# Patient Record
Sex: Female | Born: 1935 | Hispanic: No | Marital: Married | Smoking: Never smoker
Health system: Southern US, Community
[De-identification: ages and names within clinical notes are randomized; demographics above are authoritative.]

## PROBLEM LIST (undated history)

## (undated) DIAGNOSIS — I10 Essential (primary) hypertension: Secondary | ICD-10-CM

---

## 2000-04-22 ENCOUNTER — Ambulatory Visit (HOSPITAL_COMMUNITY): Admission: RE | Admit: 2000-04-22 | Discharge: 2000-04-22 | Payer: Self-pay | Admitting: Family Medicine

## 2000-04-22 ENCOUNTER — Encounter: Payer: Self-pay | Admitting: Family Medicine

## 2004-09-14 ENCOUNTER — Ambulatory Visit: Payer: Self-pay | Admitting: Nurse Practitioner

## 2006-05-05 ENCOUNTER — Encounter: Payer: Self-pay | Admitting: Vascular Surgery

## 2006-05-05 ENCOUNTER — Ambulatory Visit (HOSPITAL_COMMUNITY): Admission: RE | Admit: 2006-05-05 | Discharge: 2006-05-05 | Payer: Self-pay | Admitting: Family Medicine

## 2006-05-05 ENCOUNTER — Ambulatory Visit: Payer: Self-pay | Admitting: Nurse Practitioner

## 2006-05-06 ENCOUNTER — Ambulatory Visit: Payer: Self-pay | Admitting: Nurse Practitioner

## 2006-05-10 ENCOUNTER — Ambulatory Visit: Payer: Self-pay | Admitting: Nurse Practitioner

## 2006-05-12 ENCOUNTER — Ambulatory Visit: Payer: Self-pay | Admitting: Nurse Practitioner

## 2006-05-17 ENCOUNTER — Ambulatory Visit: Payer: Self-pay | Admitting: Nurse Practitioner

## 2006-05-19 ENCOUNTER — Ambulatory Visit: Payer: Self-pay | Admitting: Nurse Practitioner

## 2006-05-26 ENCOUNTER — Ambulatory Visit: Payer: Self-pay | Admitting: Nurse Practitioner

## 2006-06-02 ENCOUNTER — Ambulatory Visit: Payer: Self-pay | Admitting: Nurse Practitioner

## 2006-06-20 ENCOUNTER — Ambulatory Visit: Payer: Self-pay | Admitting: Nurse Practitioner

## 2006-06-23 ENCOUNTER — Ambulatory Visit: Payer: Self-pay | Admitting: Nurse Practitioner

## 2006-07-21 ENCOUNTER — Ambulatory Visit: Payer: Self-pay | Admitting: Nurse Practitioner

## 2006-07-25 ENCOUNTER — Ambulatory Visit: Payer: Self-pay | Admitting: Nurse Practitioner

## 2006-08-01 ENCOUNTER — Ambulatory Visit: Payer: Self-pay | Admitting: Nurse Practitioner

## 2006-08-08 ENCOUNTER — Ambulatory Visit: Payer: Self-pay | Admitting: Nurse Practitioner

## 2006-08-15 ENCOUNTER — Ambulatory Visit: Payer: Self-pay | Admitting: Nurse Practitioner

## 2006-08-22 ENCOUNTER — Ambulatory Visit: Payer: Self-pay | Admitting: Nurse Practitioner

## 2006-09-21 ENCOUNTER — Ambulatory Visit: Payer: Self-pay | Admitting: Nurse Practitioner

## 2006-10-17 ENCOUNTER — Ambulatory Visit: Payer: Self-pay | Admitting: Nurse Practitioner

## 2007-02-27 ENCOUNTER — Ambulatory Visit: Payer: Self-pay | Admitting: Internal Medicine

## 2007-03-17 ENCOUNTER — Ambulatory Visit: Payer: Self-pay | Admitting: Internal Medicine

## 2007-03-21 ENCOUNTER — Ambulatory Visit (HOSPITAL_COMMUNITY): Admission: RE | Admit: 2007-03-21 | Discharge: 2007-03-21 | Payer: Self-pay | Admitting: Nurse Practitioner

## 2007-04-21 ENCOUNTER — Ambulatory Visit: Payer: Self-pay | Admitting: Family Medicine

## 2007-12-05 ENCOUNTER — Ambulatory Visit: Payer: Self-pay | Admitting: Internal Medicine

## 2007-12-07 ENCOUNTER — Ambulatory Visit (HOSPITAL_COMMUNITY): Admission: RE | Admit: 2007-12-07 | Discharge: 2007-12-07 | Payer: Self-pay | Admitting: Nurse Practitioner

## 2007-12-19 ENCOUNTER — Ambulatory Visit (HOSPITAL_COMMUNITY): Admission: RE | Admit: 2007-12-19 | Discharge: 2007-12-19 | Payer: Self-pay | Admitting: Family Medicine

## 2007-12-19 ENCOUNTER — Encounter (INDEPENDENT_AMBULATORY_CARE_PROVIDER_SITE_OTHER): Payer: Self-pay | Admitting: Family Medicine

## 2008-08-21 ENCOUNTER — Ambulatory Visit: Payer: Self-pay | Admitting: Internal Medicine

## 2008-08-21 ENCOUNTER — Encounter (INDEPENDENT_AMBULATORY_CARE_PROVIDER_SITE_OTHER): Payer: Self-pay | Admitting: Internal Medicine

## 2008-08-21 LAB — CONVERTED CEMR LAB
ALT: 18 units/L (ref 0–35)
AST: 21 units/L (ref 0–37)
Albumin: 4.4 g/dL (ref 3.5–5.2)
Basophils Absolute: 0 10*3/uL (ref 0.0–0.1)
Basophils Relative: 0 % (ref 0–1)
Calcium: 9.1 mg/dL (ref 8.4–10.5)
Chloride: 105 meq/L (ref 96–112)
Creatinine, Ser: 0.78 mg/dL (ref 0.40–1.20)
LDL Cholesterol: 171 mg/dL — ABNORMAL HIGH (ref 0–99)
MCHC: 32.6 g/dL (ref 30.0–36.0)
Neutro Abs: 1.7 10*3/uL (ref 1.7–7.7)
Neutrophils Relative %: 42 % — ABNORMAL LOW (ref 43–77)
Potassium: 3.8 meq/L (ref 3.5–5.3)
RDW: 14.7 % (ref 11.5–15.5)
Total CHOL/HDL Ratio: 4.4

## 2008-10-28 DEATH — deceased

## 2009-03-05 ENCOUNTER — Ambulatory Visit: Payer: Self-pay | Admitting: Family Medicine

## 2009-03-27 ENCOUNTER — Ambulatory Visit (HOSPITAL_COMMUNITY): Admission: RE | Admit: 2009-03-27 | Discharge: 2009-03-27 | Payer: Self-pay | Admitting: Internal Medicine

## 2009-03-27 ENCOUNTER — Ambulatory Visit: Payer: Self-pay | Admitting: Family Medicine

## 2009-04-10 ENCOUNTER — Ambulatory Visit (HOSPITAL_COMMUNITY): Admission: RE | Admit: 2009-04-10 | Discharge: 2009-04-10 | Payer: Self-pay | Admitting: Family Medicine

## 2009-05-12 ENCOUNTER — Encounter (INDEPENDENT_AMBULATORY_CARE_PROVIDER_SITE_OTHER): Payer: Self-pay | Admitting: Internal Medicine

## 2009-05-12 ENCOUNTER — Ambulatory Visit: Payer: Self-pay | Admitting: Internal Medicine

## 2009-05-12 LAB — CONVERTED CEMR LAB
ALT: 19 units/L (ref 0–35)
AST: 23 units/L (ref 0–37)
Albumin: 4.1 g/dL (ref 3.5–5.2)
CO2: 23 meq/L (ref 19–32)
Calcium: 9 mg/dL (ref 8.4–10.5)
Chloride: 103 meq/L (ref 96–112)
Creatinine, Ser: 0.77 mg/dL (ref 0.40–1.20)
Eosinophils Absolute: 0.6 10*3/uL (ref 0.0–0.7)
Lymphocytes Relative: 23 % (ref 12–46)
Lymphs Abs: 1.3 10*3/uL (ref 0.7–4.0)
MCV: 95 fL (ref 78.0–100.0)
Monocytes Relative: 8 % (ref 3–12)
Neutrophils Relative %: 57 % (ref 43–77)
Potassium: 4.2 meq/L (ref 3.5–5.3)
RBC: 4.21 M/uL (ref 3.87–5.11)
Sodium: 139 meq/L (ref 135–145)
TSH: 1.186 microintl units/mL (ref 0.350–4.500)
Total Protein: 7.1 g/dL (ref 6.0–8.3)
WBC: 5.4 10*3/uL (ref 4.0–10.5)

## 2009-05-19 ENCOUNTER — Ambulatory Visit: Payer: Self-pay | Admitting: Internal Medicine

## 2009-06-09 ENCOUNTER — Ambulatory Visit: Payer: Self-pay | Admitting: Internal Medicine

## 2009-06-30 ENCOUNTER — Ambulatory Visit: Payer: Self-pay | Admitting: Family Medicine

## 2009-09-10 ENCOUNTER — Encounter: Payer: Self-pay | Admitting: Internal Medicine

## 2009-09-10 ENCOUNTER — Ambulatory Visit: Payer: Self-pay | Admitting: Internal Medicine

## 2009-09-10 ENCOUNTER — Ambulatory Visit: Admission: RE | Admit: 2009-09-10 | Discharge: 2009-09-10 | Payer: Self-pay | Admitting: Internal Medicine

## 2009-09-10 ENCOUNTER — Ambulatory Visit: Payer: Self-pay | Admitting: Vascular Surgery

## 2010-02-04 ENCOUNTER — Ambulatory Visit: Payer: Self-pay | Admitting: Family Medicine

## 2010-03-18 ENCOUNTER — Ambulatory Visit: Payer: Self-pay | Admitting: Internal Medicine

## 2010-03-18 LAB — CONVERTED CEMR LAB
BUN: 11 mg/dL (ref 6–23)
Calcium: 9.8 mg/dL (ref 8.4–10.5)
Creatinine, Ser: 0.78 mg/dL (ref 0.40–1.20)

## 2010-03-24 ENCOUNTER — Ambulatory Visit: Payer: Self-pay | Admitting: Internal Medicine

## 2010-07-14 ENCOUNTER — Encounter (INDEPENDENT_AMBULATORY_CARE_PROVIDER_SITE_OTHER): Payer: Self-pay | Admitting: Internal Medicine

## 2010-07-14 LAB — CONVERTED CEMR LAB
Chloride: 106 meq/L (ref 96–112)
Hep A Total Ab: POSITIVE — AB
Potassium: 4 meq/L (ref 3.5–5.3)
Vit D, 25-Hydroxy: 37 ng/mL (ref 30–89)

## 2010-11-26 ENCOUNTER — Encounter: Payer: Self-pay | Admitting: Family Medicine

## 2010-11-26 ENCOUNTER — Ambulatory Visit: Payer: Medicare Other | Admitting: Family Medicine

## 2010-11-26 DIAGNOSIS — Z23 Encounter for immunization: Secondary | ICD-10-CM

## 2010-12-03 NOTE — Assessment & Plan Note (Signed)
Summary: FLU SHOT    Current Allergies: No known allergies   The patient and/or caregiver has been counseled thoroughly with regard to medications prescribed including dosage, schedule, interactions, rationale for use, and possible side effects and they verbalize understanding.  Diagnoses and expected course of recovery discussed and will return if not improved as expected or if the condition worsens. Patient and/or caregiver verbalized understanding.    Immunizations Administered:  Influenza Vaccine:    Vaccine Type: FLULAVAL    Site: right deltoid    Mfr: GlaxoSmithKline    Dose: 0.5 ml    Route: IM    Given by: Standley Dakins MD    Exp. Date: 02/26/2011    Lot #: [id number]   Immunizations Administered:  Influenza Vaccine:    Vaccine Type: FLULAVAL    Site: right deltoid    Mfr: GlaxoSmithKline    Dose: 0.5 ml    Route: IM    Given by: Standley Dakins MD    Exp. Date: 02/26/2011    Lot #: [id number]  Flu Vaccine Consent Questions:    Do you have a history of severe allergic reactions to this vaccine? no    Any prior history of allergic reactions to egg and/or gelatin? no    Do you have a sensitivity to the preservative Thimersol? no    Do you have a past history of Guillan-Barre Syndrome? no    Do you currently have an acute febrile illness? no    Have you ever had a severe reaction to latex? no    Vaccine information given and explained to patient? yes    Are you currently pregnant? no

## 2011-12-14 ENCOUNTER — Observation Stay: Payer: Self-pay | Admitting: Student

## 2011-12-14 LAB — URINALYSIS, COMPLETE
Bacteria: NONE SEEN
Bilirubin,UR: NEGATIVE
Glucose,UR: NEGATIVE mg/dL (ref 0–75)
Leukocyte Esterase: NEGATIVE
Nitrite: NEGATIVE
Specific Gravity: 1.006 (ref 1.003–1.030)
Squamous Epithelial: 1
WBC UR: <1 /HPF (ref 0–5)

## 2011-12-14 LAB — TROPONIN I
Troponin-I: <0.02 ng/mL
Troponin-I: <0.02 ng/mL

## 2011-12-14 LAB — COMPREHENSIVE METABOLIC PANEL
Bilirubin,Total: 0.4 mg/dL (ref 0.2–1.0)
Calcium, Total: 8.6 mg/dL (ref 8.5–10.1)
Chloride: 92 mmol/L — ABNORMAL LOW (ref 98–107)
Co2: 27 mmol/L (ref 21–32)
Creatinine: 1.48 mg/dL — ABNORMAL HIGH (ref 0.60–1.30)
EGFR (African American): 42 — ABNORMAL LOW
SGPT (ALT): 34 U/L

## 2011-12-14 LAB — CBC
MCV: 90 fL (ref 80–100)
RBC: 4.12 10*6/uL (ref 3.80–5.20)
RDW: 13.9 % (ref 11.5–14.5)
WBC: 6 10*3/uL (ref 3.6–11.0)

## 2011-12-14 LAB — CK TOTAL AND CKMB (NOT AT ARMC)
CK, Total: 565 U/L — ABNORMAL HIGH (ref 21–215)
CK-MB: 16 ng/mL — ABNORMAL HIGH (ref 0.5–3.6)
CK-MB: 9.6 ng/mL — ABNORMAL HIGH (ref 0.5–3.6)

## 2011-12-14 LAB — MAGNESIUM: Magnesium: 2.2 mg/dL

## 2011-12-15 DIAGNOSIS — R079 Chest pain, unspecified: Secondary | ICD-10-CM

## 2011-12-15 LAB — BASIC METABOLIC PANEL
Anion Gap: 6 — ABNORMAL LOW (ref 7–16)
BUN: 20 mg/dL — ABNORMAL HIGH (ref 7–18)
Creatinine: 0.82 mg/dL (ref 0.60–1.30)
EGFR (African American): >60
Glucose: 103 mg/dL — ABNORMAL HIGH (ref 65–99)

## 2011-12-15 LAB — LIPID PANEL
Cholesterol: 199 mg/dL (ref 0–200)
HDL Cholesterol: 57 mg/dL (ref 40–60)
VLDL Cholesterol, Calc: 11 mg/dL (ref 5–40)

## 2011-12-15 LAB — CK TOTAL AND CKMB (NOT AT ARMC): CK, Total: 326 U/L — ABNORMAL HIGH (ref 21–215)

## 2011-12-20 LAB — CULTURE, BLOOD (SINGLE)

## 2012-03-31 ENCOUNTER — Other Ambulatory Visit (HOSPITAL_COMMUNITY): Payer: Self-pay | Admitting: Family Medicine

## 2012-03-31 DIAGNOSIS — M899 Disorder of bone, unspecified: Secondary | ICD-10-CM

## 2012-04-05 ENCOUNTER — Ambulatory Visit (HOSPITAL_COMMUNITY)
Admission: RE | Admit: 2012-04-05 | Discharge: 2012-04-05 | Disposition: A | Discharge status: Home / Self Care | Payer: Medicare Other | Source: Ambulatory Visit | Attending: Family Medicine | Admitting: Family Medicine

## 2012-04-05 DIAGNOSIS — M899 Disorder of bone, unspecified: Secondary | ICD-10-CM

## 2012-04-05 DIAGNOSIS — Z13820 Encounter for screening for osteoporosis: Secondary | ICD-10-CM | POA: Insufficient documentation

## 2012-04-05 DIAGNOSIS — Z780 Asymptomatic menopausal state: Secondary | ICD-10-CM | POA: Insufficient documentation

## 2014-05-11 ENCOUNTER — Emergency Department: Payer: Self-pay | Admitting: Emergency Medicine

## 2014-05-24 ENCOUNTER — Emergency Department: Payer: Self-pay | Admitting: Emergency Medicine

## 2015-01-19 NOTE — Discharge Summary (Signed)
PATIENT NAMRainey Pines:  [name], [name]#:  [id number] DATE OF BIRTH:  1918-08-08  DATE OF ADMISSION:  12/14/2011 DATE OF DISCHARGE:  12/15/2011  CHIEF COMPLAINT: Chest pain.   DISCHARGE DIAGNOSES:  1. Chest pain likely from bronchitis. 2. Acute renal failure, resolved. 3. Asthma. 4. Hypertension. 5. History of arthritis.   DISCHARGE MEDICATIONS:  1. HCTZ/lisinopril 25/20 mg 1 tab daily.  2. Mobic 7.5 mg daily.  3. Advair 250/50 1 puff inhaled two times a day.  4. Calcium with Vitamin D 1200 mg once a day. 5. Levaquin 500 mg daily for four days.  6. Omeprazole 20 mg daily for 21 days.   DIET: Low sodium, ADA diet.   ACTIVITY: As tolerated.   FOLLOW-UP: Please follow-up with your PCP within 1 to 2 weeks for follow-up.   DISPOSITION: Home.   HISTORY OF PRESENT ILLNESS: For full details, please see the history and physical dictated on 12/14/2011. Briefly, this is a 81 year old Asian female who has history of hypertension, asthma, and osteoarthritis presenting with chest pain which was noted to be pleuritic in nature and worse with coughing and has had some palpitations. She was admitted to the hospitalist service for observation and further evaluation for her chest pain and to rule out acute coronary syndrome.   SIGNIFICANT LABS AND IMAGING: Initial glucose 102. BUN 37, creatinine 1.48; creatinine on discharge 0.82, BUN 20. Initial sodium 133. LFTs AST 38, otherwise within normal limits. CK total 565. CK-MB 16, then 9.6, then 6.9. Troponins negative x3. TSH 0.681. WBC 6, hemoglobin 12.4, hematocrit 36.9, platelets 219. D-dimer elevated at 0.58. Blood cultures no growth to date x2. Urinalysis not suggestive of infection. Stress test negative for ischemia. Chest, PA and lateral, no acute disease of the chest. Hip, right, complete, showing no acute osseous injury of the right hip. Ultrasound of the lower extremities no evidence for DVT.   HOSPITAL COURSE: The patient was admitted for observation.  She underwent cyclic cardiac markers which were negative from the troponin. The patient did have slightly elevated CK-MB which did trend down. This was likely in the setting of musculoskeletal injury. The patient also did have a slightly elevated CK total as well. The patient underwent a stress test which was negative for ischemia. She has no further chest pains currently. She has no shortness of breath. She did have a cough which was pleuritic in nature and did not have any evidence for pneumonia on x-ray or had a fever or leukocytosis. She was started on Levaquin and she is going to be discharged with another four days for bronchitis. The patient did have mild acute renal failure on arrival and HCTZ was held and she was started on some gentle fluids. Creatinine by discharge has normalized. At this point as she has been ruled out for acute coronary syndrome, the patient will be discharged to home. Her son was in the room who acted as a Nurse, learning disabilitytranslator and further follow-up with her primary care physician was stressed to the patient's son.  TOTAL TIME SPENT: 30 minutes.   CODE STATUS: The patient is FULL CODE.  ____________________________ Krystal EatonShayiq Winifred Balogh, MD sa:drc D: 12/15/2011 17:36:14 ET T: 12/16/2011 10:43:47 ET JOB#: [id number]  cc: Krystal EatonShayiq Karnell Vanderloop, MD, <Dictator> Krystal EatonSHAYIQ Kalob Bergen MD ELECTRONICALLY SIGNED 12/17/2011 18:42

## 2015-01-19 NOTE — H&P (Signed)
PATIENT NAMEAFRICA, [name] MR#:  [id number] DATE OF BIRTH:  [birth year]  DATE OF ADMISSION:  12/14/2011  PRIMARY MD: Unknown.  ED REFERRING DOCTOR: Dr. Carollee Massed    CHIEF COMPLAINT: Chest pain in the substernal area for one week's duration as well as palpitations.   HISTORY OF PRESENT ILLNESS: The patient is a 81 year old Asian female who was brought by her family. She is non-English-speaking who has been having pain described as a sharp type of pain ongoing for about one week duration. It is worse at nighttime according to the son. He also reports that she has been having heart palpitations beating fast. It comes and goes. He reports that the chest pain is worse with coughing and then sometimes it comes on its own. The patient has not had any burning sensation, however, the pain is worse at nighttime. She has not had any shortness of breath, dyspnea on exertion, or any orthopnea. The patient along with this chest pain for about a week has been having a nonproductive cough. Has felt feverish. He reports that she has not had any wheezing. She also has been having pain in the right side of her leg down to her hip.   PAST MEDICAL HISTORY:  1. Hypertension.  2. Asthma.  3. Osteoarthritis.   PAST SURGICAL HISTORY: None.   ALLERGIES: None.   MEDICATIONS:  1. Mobic 7.5 p.o. daily.  2. Hydrochlorothiazide/lisinopril 25/20 daily.  3. Advair 250/50 INH b.i.d.   PAST SURGICAL HISTORY: None according to her son.   SOCIAL HISTORY: No smoking. No alcohol. No drugs. Lives with her son.   FAMILY HISTORY: Positive for hypertension.   REVIEW OF SYSTEMS: CONSTITUTIONAL: Denies any fevers. Complains of fatigue, weakness, pain in the right leg. No weight loss. No weight gain. EYES: No blurred or double vision. No pain. No redness. No inflammation. No glaucoma. No cataracts. ENT: No tinnitus. No ear pain. No hearing loss. No allergies, seasonal or year round. No epistaxis. No nasal discharge. No snoring. No  postnasal drip. RESPIRATORY: Has had a nonproductive cough. Has no wheezing. No hemoptysis. No dyspnea. No painful respirations. No chronic obstructive pulmonary disease. No TB. No pneumonia. CARDIOVASCULAR: Chest pain as above. No orthopnea. No edema. Has had palpitations but no arrhythmia. No syncope. Has high blood pressure. GI: No nausea, vomiting, diarrhea. No abdominal pain. No hematemesis. No melena. GU: Denies any dysuria, hematuria, renal calculus, or frequency. ENDOCRINE: Denies any polydipsia, nocturia, or thyroid problems. No increase in sweating, heat or cold intolerance. HEME/LYMPH: No anemia, easy bruisability, or bleeding. SKIN: No acne. No rash. No changes in mole, hair or skin. MUSCULOSKELETAL: Has chronic left leg pain, now has right leg pain as above. No gout. No swelling. NEUROLOGIC: No numbness. No CVA. No TIA. No seizures. PSYCHIATRIC: No anxiety. No insomnia. No ADD. No OCD.   PHYSICAL EXAMINATION:   VITAL SIGNS: Temperature 97.6, pulse 72, respirations 18, blood pressure 126/59, O2 99% on room air.   GENERAL: The patient is an elderly 81 year old female in no acute distress, appears younger than her stated age.   HEENT: Head atraumatic, normocephalic. Pupils equally round, reactive to light and accommodation. Extraocular movements intact. There is no conjunctival pallor. No scleral icterus. Oropharynx is clear without any exudates.   NECK: There is no thyromegaly. No carotid bruits.   CARDIOVASCULAR: Regular rate and rhythm. No murmurs, rubs, clicks, or gallops. PMI is not displaced.   LUNGS: Clear to auscultation bilaterally without any rales, rhonchi, or wheezing.  ABDOMEN: Soft, nontender, nondistended. Positive bowel sounds x4.   EXTREMITIES: No clubbing, cyanosis, or edema.   SKIN: No rash.   LYMPHATICS: No lymph nodes palpable.   MUSCULOSKELETAL: She has pain on the right hip with raising, flexion, and extension of her leg. She has no reproducible chest wall  tenderness.   NEUROLOGICAL: Awake, alert, not sure if she is oriented due to her inability to communicate.   PSYCH: Not anxious or depressed.   LABORATORY, DIAGNOSTIC, AND RADIOLOGICAL DATA: Blood glucose 102, BUN 37, creatinine 1.48, sodium 133, potassium 3.9, chloride 92, CO2 27, calcium 8.6. LFTs AST 45, ALT 38. Total CPK 595. CK-MB 16.0. Troponin less than 0.02. WBC 6.0, hemoglobin 12.4, platelet count 219. D-dimer slightly elevated at 0.58. Urinalysis nitrites negative, leukocytes negative. Ultrasound of the lower extremities is negative for DVT.   ASSESSMENT AND PLAN: The patient is a 81 year old Asian female brought to the ED who is relatively healthy with chest pain for one week. 1. Chest pain and palpitations. At this time will monitor on tele. Check serial enzymes. Will do an adenosine stress test in the a.m. Symptoms could be possibly musculoskeletal. Due to her renal failure I won't do any anti-inflammatories but will try Tylenol. Possibly could be GERD related. I'll place her on PPIs.  2. Right leg pain, negative Doppler. Possibly could be due to severe degenerative hip disease. Will check right hip x-ray. If continues to have symptoms, will need outpatient evaluation.  3. Mild renal insufficiency likely due to HCTZ therapy. Also could be chronic renal insufficiency. Will give her low dose IV fluids. Hold HCTZ.  4. Hypertension. Continue lisinopril.  5. Asthma. Will continue her inhalers. Will add Combivent as needed.  6. Cough, likely bronchitis. Will place her on p.o. Levaquin.  7. Miscellaneous. Will place her on Lovenox for DVT prophylaxis.     TIME SPENT: 35 minutes.  ____________________________ Lacie ScottsShreyang H. Allena KatzPatel, MD shp:drc D: 12/14/2011 19:37:03 ET T: 12/15/2011 07:01:48 ET JOB#: [id number]  cc: [name] H. Allena KatzPatel, MD, <Dictator> Brianna CarwinSHREYANG H Javen Hinderliter MD ELECTRONICALLY SIGNED 12/16/2011 7:01

## 2018-10-27 ENCOUNTER — Other Ambulatory Visit: Payer: Self-pay | Admitting: Family Medicine

## 2018-10-27 DIAGNOSIS — R011 Cardiac murmur, unspecified: Secondary | ICD-10-CM

## 2018-11-10 ENCOUNTER — Ambulatory Visit
Admission: RE | Admit: 2018-11-10 | Discharge: 2018-11-10 | Disposition: A | Discharge status: Home / Self Care | Payer: Medicare Other | Source: Ambulatory Visit | Attending: Family Medicine | Admitting: Family Medicine

## 2018-11-10 DIAGNOSIS — R011 Cardiac murmur, unspecified: Secondary | ICD-10-CM | POA: Diagnosis present

## 2018-11-10 DIAGNOSIS — I080 Rheumatic disorders of both mitral and aortic valves: Secondary | ICD-10-CM | POA: Insufficient documentation

## 2018-11-10 DIAGNOSIS — I10 Essential (primary) hypertension: Secondary | ICD-10-CM | POA: Insufficient documentation

## 2018-11-10 NOTE — Progress Notes (Signed)
*  PRELIMINARY RESULTS* Echocardiogram 2D Echocardiogram has been performed.  [name] 11/10/2018, 11:35 AM

## 2019-03-15 ENCOUNTER — Encounter: Payer: Self-pay | Admitting: Emergency Medicine

## 2019-03-15 ENCOUNTER — Other Ambulatory Visit: Payer: Self-pay

## 2019-03-15 ENCOUNTER — Emergency Department
Admission: EM | Admit: 2019-03-15 | Discharge: 2019-03-15 | Disposition: A | Discharge status: Home / Self Care | Payer: Medicare Other | Attending: Emergency Medicine | Admitting: Emergency Medicine

## 2019-03-15 DIAGNOSIS — I10 Essential (primary) hypertension: Secondary | ICD-10-CM | POA: Insufficient documentation

## 2019-03-15 DIAGNOSIS — R2242 Localized swelling, mass and lump, left lower limb: Secondary | ICD-10-CM | POA: Diagnosis present

## 2019-03-15 DIAGNOSIS — R600 Localized edema: Secondary | ICD-10-CM

## 2019-03-15 DIAGNOSIS — L03115 Cellulitis of right lower limb: Secondary | ICD-10-CM | POA: Diagnosis not present

## 2019-03-15 HISTORY — DX: Essential (primary) hypertension: I10

## 2019-03-15 LAB — URINALYSIS, ROUTINE W REFLEX MICROSCOPIC
Bilirubin Urine: NEGATIVE
Glucose, UA: NEGATIVE mg/dL
Ketones, ur: 5 mg/dL — AB
Leukocytes,Ua: NEGATIVE
Nitrite: NEGATIVE
Protein, ur: NEGATIVE mg/dL
Specific Gravity, Urine: 1.008 (ref 1.005–1.030)
pH: 5 (ref 5.0–8.0)

## 2019-03-15 LAB — CBC WITH DIFFERENTIAL/PLATELET
Abs Immature Granulocytes: 0.05 10*3/uL (ref 0.00–0.07)
Basophils Absolute: 0 10*3/uL (ref 0.0–0.1)
Basophils Relative: 0 %
Eosinophils Absolute: 0.4 10*3/uL (ref 0.0–0.5)
Eosinophils Relative: 4 %
HCT: 34.7 % — ABNORMAL LOW (ref 36.0–46.0)
Hemoglobin: 11.5 g/dL — ABNORMAL LOW (ref 12.0–15.0)
Immature Granulocytes: 1 %
Lymphocytes Relative: 7 %
Lymphs Abs: 0.7 10*3/uL (ref 0.7–4.0)
MCH: 29.9 pg (ref 26.0–34.0)
MCHC: 33.1 g/dL (ref 30.0–36.0)
MCV: 90.4 fL (ref 80.0–100.0)
Monocytes Absolute: 0.4 10*3/uL (ref 0.1–1.0)
Monocytes Relative: 4 %
Neutro Abs: 8.8 10*3/uL — ABNORMAL HIGH (ref 1.7–7.7)
Neutrophils Relative %: 84 %
Platelets: 208 10*3/uL (ref 150–400)
RBC: 3.84 MIL/uL — ABNORMAL LOW (ref 3.87–5.11)
RDW: 13.6 % (ref 11.5–15.5)
WBC: 10.3 10*3/uL (ref 4.0–10.5)
nRBC: 0 % (ref 0.0–0.2)

## 2019-03-15 LAB — COMPREHENSIVE METABOLIC PANEL
ALT: 29 U/L (ref 0–44)
AST: 24 U/L (ref 15–41)
Albumin: 3.4 g/dL — ABNORMAL LOW (ref 3.5–5.0)
Alkaline Phosphatase: 51 U/L (ref 38–126)
Anion gap: 10 (ref 5–15)
BUN: 22 mg/dL (ref 8–23)
CO2: 23 mmol/L (ref 22–32)
Calcium: 8.2 mg/dL — ABNORMAL LOW (ref 8.9–10.3)
Chloride: 99 mmol/L (ref 98–111)
Creatinine, Ser: 0.97 mg/dL (ref 0.44–1.00)
GFR calc Af Amer: 56 mL/min — ABNORMAL LOW (ref >60)
GFR calc non Af Amer: 48 mL/min — ABNORMAL LOW (ref >60)
Glucose, Bld: 119 mg/dL — ABNORMAL HIGH (ref 70–99)
Potassium: 3.4 mmol/L — ABNORMAL LOW (ref 3.5–5.1)
Sodium: 132 mmol/L — ABNORMAL LOW (ref 135–145)
Total Bilirubin: 0.7 mg/dL (ref 0.3–1.2)
Total Protein: 7.1 g/dL (ref 6.5–8.1)

## 2019-03-15 LAB — LACTIC ACID, PLASMA: Lactic Acid, Venous: 0.7 mmol/L (ref 0.5–1.9)

## 2019-03-15 LAB — BRAIN NATRIURETIC PEPTIDE: B Natriuretic Peptide: 173 pg/mL — ABNORMAL HIGH (ref 0.0–100.0)

## 2019-03-15 MED ORDER — FUROSEMIDE 20 MG PO TABS: 20.0000 mg | ORAL_TABLET | Freq: Every day | ORAL | 1 refills | Status: DC

## 2019-03-15 MED ORDER — CEPHALEXIN 500 MG PO CAPS: 500.0000 mg | ORAL_CAPSULE | Freq: Two times a day (BID) | ORAL | 0 refills | Status: DC

## 2019-03-15 NOTE — ED Notes (Signed)
Report given to Stephen RN.

## 2019-03-15 NOTE — ED Provider Notes (Addendum)
[facility] Emergency Department Provider Note   ____________________________________________    I have reviewed the triage vital signs and the nursing notes.   HISTORY  Chief Complaint Leg Swelling  Son has insisted on interpreting for his mother who only speaks [place]   HPI [name] is a 85 y.o. female who presents with right greater than left leg swelling for over a week.  Son reports that both legs have been swollen however the right has been worse and has been weeping fluid.  Now she has redness extending up her leg.  No reports of fevers.  Has not take anything for this.  No history of this in the past.  No shortness of breath or cough.  Past Medical History:  Diagnosis Date  . Hypertension     There are no active problems to display for this patient.     Prior to Admission medications   Medication Sig Start Date End Date Taking? Authorizing Provider  cephALEXin (KEFLEX) 500 MG capsule Take 1 capsule (500 mg total) by mouth 2 (two) times daily. 03/15/19   Jene EveryKinner, Eusebia Grulke, MD  furosemide (LASIX) 20 MG tablet Take 1 tablet (20 mg total) by mouth daily. 03/15/19 03/14/20  Jene EveryKinner, Loyde Orth, MD     Allergies Patient has no known allergies.  No family history on file.  Social History No alcohol or drug use Review of Systems  Constitutional: No fevers reported Eyes: No visual changes.  ENT: No neck pain Cardiovascular: Denies chest pain. Respiratory: Denies shortness of breath.  No cough Gastrointestinal: No abdominal pain.  No nausea, no vomiting.   Genitourinary: Negative for dysuria. Musculoskeletal: As above Skin: As above Neurological: Negative for headaches or weakness   ____________________________________________   PHYSICAL EXAM:  VITAL SIGNS: ED Triage Vitals  Enc Vitals Group     BP 03/15/19 1023 (!) 193/92     Pulse Rate 03/15/19 1023 91     Resp 03/15/19 1023 16     Temp 03/15/19 1023 98 F (36.7 C)   Temp Source 03/15/19 1023 Oral     SpO2 03/15/19 1023 95 %     Weight 03/15/19 1024 52.2 kg (115 lb)     Height 03/15/19 1024 1.524 m (5')     Head Circumference --      Peak Flow --      Pain Score 03/15/19 1034 10     Pain Loc --      Pain Edu? --      Excl. in GC? --     Constitutional: Alert, no acute distress  Nose: No congestion/rhinnorhea. Mouth/Throat: Mucous membranes are moist.    Cardiovascular: Normal rate, regular rhythm. Grossly normal heart sounds.  Good peripheral circulation. Respiratory: Normal respiratory effort.  No retractions. Lungs CTAB. Gastrointestinal: Soft and nontender. No distention.  No CVA tenderness. Genitourinary: deferred Musculoskeletal: Right leg with 2+ edema significant weeping, redness to the lower leg extending up to the medial thigh.  Left leg with 1+ edema no significant weeping or erythema Neurologic:  Normal speech and language. No gross focal neurologic deficits are appreciated.  Skin:  Skin is warm, see above   ____________________________________________   LABS (all labs ordered are listed, but only abnormal results are displayed)  Labs Reviewed  COMPREHENSIVE METABOLIC PANEL - Abnormal; Notable for the following components:      Result Value   Sodium 132 (*)    Potassium 3.4 (*)    Glucose, Bld 119 (*)  Calcium 8.2 (*)    Albumin 3.4 (*)    GFR calc non Af Amer 48 (*)    GFR calc Af Amer 56 (*)    All other components within normal limits  CBC WITH DIFFERENTIAL/PLATELET - Abnormal; Notable for the following components:   RBC 3.84 (*)    Hemoglobin 11.5 (*)    HCT 34.7 (*)    Neutro Abs 8.8 (*)    All other components within normal limits  URINALYSIS, ROUTINE W REFLEX MICROSCOPIC - Abnormal; Notable for the following components:   Color, Urine STRAW (*)    APPearance CLEAR (*)    Hgb urine dipstick SMALL (*)    Ketones, ur 5 (*)    Bacteria, UA RARE (*)    All other components within normal limits  BRAIN  NATRIURETIC PEPTIDE - Abnormal; Notable for the following components:   B Natriuretic Peptide 173.0 (*)    All other components within normal limits  CULTURE, BLOOD (ROUTINE X 2)  CULTURE, BLOOD (ROUTINE X 2)  LACTIC ACID, PLASMA  LACTIC ACID, PLASMA   ____________________________________________  EKG  ED ECG REPORT I, [name], the attending physician, personally viewed and interpreted this ECG.  Date: 03/26/2019  Rhythm: normal sinus rhythm QRS Axis: normal Intervals: Right bundle branch block ST/T Wave abnormalities: normal Narrative Interpretation: no evidence of acute ischemia  ____________________________________________  RADIOLOGY  None ____________________________________________   PROCEDURES  Procedure(s) performed: No  Procedures   Critical Care performed: No ____________________________________________   INITIAL IMPRESSION / ASSESSMENT AND PLAN / ED COURSE  Pertinent labs & imaging results that were available during my care of the patient were reviewed by me and considered in my medical decision making (see chart for details).  Patient presents with bilateral lower extremity edema with weeping on the right and erythema suspicious for possible overlying cellulitis.  Will check labs  Lab work is overall quite reassuring, normal white blood cell count, normal lactic acid.  We will start the patient on a low-dose Lasix as well as a course of Keflex.  I have asked the patient to follow-up closely with her PCP within 1 week, if any worsening symptoms to return to the emergency department    ____________________________________________   FINAL CLINICAL IMPRESSION(S) / ED DIAGNOSES  Final diagnoses:  Leg edema  Cellulitis of right lower extremity        Note:  This document was prepared using Dragon voice recognition software and may include unintentional dictation errors.   Brianna Drafts, MD 03/15/19 1529    Brianna Drafts, MD  03/26/19 [phone]

## 2019-03-15 NOTE — ED Triage Notes (Signed)
Pt here with son, speaks [place]. Son reports R leg has been swelling for a few months. Red, draining x1 week. Son said he isn't able to get another appt until august.

## 2019-03-20 LAB — CULTURE, BLOOD (ROUTINE X 2)
Culture: NO GROWTH
Culture: NO GROWTH
Special Requests: ADEQUATE

## 2019-03-28 ENCOUNTER — Encounter: Payer: Self-pay | Admitting: Emergency Medicine

## 2019-03-28 ENCOUNTER — Emergency Department
Admission: EM | Admit: 2019-03-28 | Discharge: 2019-03-28 | Disposition: A | Discharge status: Home / Self Care | Payer: Medicare Other | Attending: Student in an Organized Health Care Education/Training Program | Admitting: Student in an Organized Health Care Education/Training Program

## 2019-03-28 ENCOUNTER — Emergency Department: Payer: Medicare Other

## 2019-03-28 ENCOUNTER — Other Ambulatory Visit: Payer: Self-pay

## 2019-03-28 DIAGNOSIS — Z79899 Other long term (current) drug therapy: Secondary | ICD-10-CM | POA: Diagnosis not present

## 2019-03-28 DIAGNOSIS — M25561 Pain in right knee: Secondary | ICD-10-CM

## 2019-03-28 DIAGNOSIS — R2241 Localized swelling, mass and lump, right lower limb: Secondary | ICD-10-CM

## 2019-03-28 DIAGNOSIS — I10 Essential (primary) hypertension: Secondary | ICD-10-CM | POA: Diagnosis not present

## 2019-03-28 DIAGNOSIS — M25461 Effusion, right knee: Secondary | ICD-10-CM | POA: Insufficient documentation

## 2019-03-28 DIAGNOSIS — M79604 Pain in right leg: Secondary | ICD-10-CM

## 2019-03-28 LAB — SYNOVIAL CELL COUNT + DIFF, W/ CRYSTALS
Crystals, Fluid: NONE SEEN
Eosinophils-Synovial: 0 %
Lymphocytes-Synovial Fld: 2 %
Monocyte-Macrophage-Synovial Fluid: 4 %
Neutrophil, Synovial: 94 %
WBC, Synovial: 27029 /mm3 — ABNORMAL HIGH (ref 0–200)

## 2019-03-28 LAB — CBC WITH DIFFERENTIAL/PLATELET
Abs Immature Granulocytes: 0.04 10*3/uL (ref 0.00–0.07)
Basophils Absolute: 0 10*3/uL (ref 0.0–0.1)
Basophils Relative: 1 %
Eosinophils Absolute: 0.2 10*3/uL (ref 0.0–0.5)
Eosinophils Relative: 3 %
HCT: 24.6 % — ABNORMAL LOW (ref 36.0–46.0)
Hemoglobin: 8 g/dL — ABNORMAL LOW (ref 12.0–15.0)
Immature Granulocytes: 1 %
Lymphocytes Relative: 17 %
Lymphs Abs: 1.1 10*3/uL (ref 0.7–4.0)
MCH: 29.3 pg (ref 26.0–34.0)
MCHC: 32.5 g/dL (ref 30.0–36.0)
MCV: 90.1 fL (ref 80.0–100.0)
Monocytes Absolute: 0.5 10*3/uL (ref 0.1–1.0)
Monocytes Relative: 8 %
Neutro Abs: 4.5 10*3/uL (ref 1.7–7.7)
Neutrophils Relative %: 70 %
Platelets: 623 10*3/uL — ABNORMAL HIGH (ref 150–400)
RBC: 2.73 MIL/uL — ABNORMAL LOW (ref 3.87–5.11)
RDW: 14 % (ref 11.5–15.5)
WBC: 6.4 10*3/uL (ref 4.0–10.5)
nRBC: 0 % (ref 0.0–0.2)

## 2019-03-28 LAB — COMPREHENSIVE METABOLIC PANEL
ALT: 39 U/L (ref 0–44)
AST: 27 U/L (ref 15–41)
Albumin: 2.6 g/dL — ABNORMAL LOW (ref 3.5–5.0)
Alkaline Phosphatase: 125 U/L (ref 38–126)
Anion gap: 10 (ref 5–15)
BUN: 22 mg/dL (ref 8–23)
CO2: 27 mmol/L (ref 22–32)
Calcium: 8.2 mg/dL — ABNORMAL LOW (ref 8.9–10.3)
Chloride: 98 mmol/L (ref 98–111)
Creatinine, Ser: 0.86 mg/dL (ref 0.44–1.00)
GFR calc Af Amer: >60 mL/min (ref >60)
GFR calc non Af Amer: 56 mL/min — ABNORMAL LOW (ref >60)
Glucose, Bld: 116 mg/dL — ABNORMAL HIGH (ref 70–99)
Potassium: 4.2 mmol/L (ref 3.5–5.1)
Sodium: 135 mmol/L (ref 135–145)
Total Bilirubin: 0.4 mg/dL (ref 0.3–1.2)
Total Protein: 7.3 g/dL (ref 6.5–8.1)

## 2019-03-28 LAB — BRAIN NATRIURETIC PEPTIDE: B Natriuretic Peptide: 342 pg/mL — ABNORMAL HIGH (ref 0.0–100.0)

## 2019-03-28 MED ORDER — ACETAMINOPHEN 500 MG PO TABS
1000.0000 mg | ORAL_TABLET | Freq: Once | ORAL | Status: AC
  Administered 2019-03-28: 1000 mg via ORAL
  Filled 2019-03-28: qty 2

## 2019-03-28 MED ORDER — DOXYCYCLINE HYCLATE 100 MG PO CAPS: 100.0000 mg | ORAL_CAPSULE | Freq: Two times a day (BID) | ORAL | 0 refills | Status: AC

## 2019-03-28 MED ORDER — KETOROLAC TROMETHAMINE 60 MG/2ML IM SOLN
30.0000 mg | Freq: Once | INTRAMUSCULAR | Status: AC
  Administered 2019-03-28: 15:00:00 30 mg via INTRAMUSCULAR
  Filled 2019-03-28: qty 2

## 2019-03-28 MED ORDER — KETOROLAC TROMETHAMINE 30 MG/ML IJ SOLN: 15.0000 mg | Freq: Once | INTRAMUSCULAR | Status: DC

## 2019-03-28 MED ORDER — LIDOCAINE 5 % EX PTCH: 1.0000 | MEDICATED_PATCH | Freq: Two times a day (BID) | CUTANEOUS | 0 refills | Status: DC

## 2019-03-28 NOTE — ED Notes (Signed)
Pt sitting in wheelchair when I came into the room, granddaughter at bedside. Reports right leg pain on and off x 1 month.

## 2019-03-28 NOTE — ED Provider Notes (Signed)
Case discussed in consultation with Dr. Rudene Christians. Presentation most c/w inflammatory arthritis.  No evidence of fracture.  Will follow up in ortho clinic for further management.   Merlyn Lot, MD 03/28/19 [phone]
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
## 2019-03-28 NOTE — Discharge Instructions (Addendum)
Please follow-up in orthopedic clinic.  Call the clinic for an appointment ASAP.  Return for any fevers, worsening pain or swelling.

## 2019-03-28 NOTE — ED Triage Notes (Signed)
Pt presents to ED via POV with c/o bilateral leg pain at this time, pt's granddaughter reports mainly R leg at this time, pt seen on 6/18 for same. Redness and swelling noted to RLE at this time. Pt states pain worse with dependent positioning.

## 2019-03-28 NOTE — ED Notes (Signed)
Spoke with Dr. Ellender Hose regarding patient care, VORB received for bloodwork and [place] studies.

## 2019-03-28 NOTE — ED Notes (Signed)
Interpreter used for discharge, pt verbalizes d/c understanding.

## 2019-03-28 NOTE — ED Notes (Signed)
2 syringes of right knee aspiration fluid taken to the lab

## 2019-03-30 LAB — PROTEIN, BODY FLUID (OTHER): Total Protein, Body Fluid Other: 4.9 g/dL

## 2019-03-30 LAB — GLUCOSE, BODY FLUID OTHER: Glucose, Body Fluid Other: 12 mg/dL

## 2019-04-01 LAB — BODY FLUID CULTURE: Culture: NO GROWTH

## 2019-04-04 ENCOUNTER — Emergency Department
Admission: EM | Admit: 2019-04-04 | Discharge: 2019-04-04 | Disposition: A | Discharge status: Home / Self Care | Payer: Medicare Other | Attending: Student in an Organized Health Care Education/Training Program | Admitting: Student in an Organized Health Care Education/Training Program

## 2019-04-04 ENCOUNTER — Other Ambulatory Visit: Payer: Self-pay

## 2019-04-04 DIAGNOSIS — Z79899 Other long term (current) drug therapy: Secondary | ICD-10-CM | POA: Diagnosis not present

## 2019-04-04 DIAGNOSIS — G8929 Other chronic pain: Secondary | ICD-10-CM | POA: Diagnosis not present

## 2019-04-04 DIAGNOSIS — M25561 Pain in right knee: Secondary | ICD-10-CM | POA: Diagnosis present

## 2019-04-04 DIAGNOSIS — I10 Essential (primary) hypertension: Secondary | ICD-10-CM | POA: Insufficient documentation

## 2019-04-04 MED ORDER — LIDOCAINE 5 % EX PTCH
1.0000 | MEDICATED_PATCH | CUTANEOUS | Status: DC
  Administered 2019-04-04: 11:00:00 1 via TRANSDERMAL
  Filled 2019-04-04: qty 1

## 2019-04-04 MED ORDER — LIDOCAINE 5 % EX PTCH: 1.0000 | MEDICATED_PATCH | Freq: Two times a day (BID) | CUTANEOUS | 0 refills | Status: AC

## 2019-04-04 MED ORDER — DICLOFENAC SODIUM 1 % TD GEL: 2.0000 g | Freq: Two times a day (BID) | TRANSDERMAL | 0 refills | Status: DC

## 2019-04-04 MED ORDER — ACETAMINOPHEN 500 MG PO TABS
1000.0000 mg | ORAL_TABLET | Freq: Once | ORAL | Status: AC
  Administered 2019-04-04: 1000 mg via ORAL
  Filled 2019-04-04: qty 2

## 2019-04-04 MED ORDER — OXYCODONE HCL 5 MG/5ML PO SOLN: 3.0000 mg | Freq: Four times a day (QID) | ORAL | 0 refills | Status: AC | PRN

## 2019-04-04 NOTE — ED Notes (Signed)
Pt up to the bedside toilet. Pt able to ambulate with one assist. Pt limping but in NAD at this time.

## 2019-04-04 NOTE — ED Notes (Signed)
Multiple attempts made with stratus interpretor for assessment. Pt unable to hear the interpretor with volume turned all the way up. Son contacted and states he can translate, and will be her in 10-15 mins.

## 2019-04-04 NOTE — TOC Initial Note (Addendum)
Transition of Care [facility]) - Initial/Assessment Note    Patient Details  Name: [name] MRN: [id number] Date of Birth: 01-07-1918  [facility]) CM/SW Contact:    Marshell Garfinkel, RN Phone Number: 04/04/2019, 10:56 AM  Clinical Narrative:                 Message left for son Mr. Chauncy Passy [phone]. I have provided a list of 3-5 star rating home health agencies with patient (for son).  I plan to offer follow up outpatient appointment with Dr. Rudene Christians and arrange for home health nurse/PT if he allows.  Update- Son Mr. Chauncy Passy at bedside. Offered home health agency list and he had no preference. Dr. Rudene Christians prefers [facility]- referral made to [place] with [facility]. Follow up appointment made with Rachelle Hora PA/Dr. Rudene Christians on Monday 04/09/19 at 0830. Son updated and agrees. Expected Discharge Plan: [place]     Patient Goals and CMS Choice   CMS Medicare.gov Compare Post Acute Care list provided to:: Patient Represenative (must comment)([place] son, [phone])    Expected Discharge Plan and Services Expected Discharge Plan: [place]                                              Prior Living Arrangements/Services                       Activities of Daily Living      Permission Sought/Granted                  Emotional Assessment              Admission diagnosis:  rt leg pain There are no active problems to display for this patient.  PCP:  Sharyne Peach, MD Pharmacy:   [facility] [address] [place] - [place] [address] [place] [address] Phone: [phone] Fax: [phone]     Social Determinants of Health (SDOH) Interventions    Readmission Risk Interventions No flowsheet data found.

## 2019-04-04 NOTE — ED Provider Notes (Signed)
[facility] Emergency Department Provider Note    First MD Initiated Contact with Patient 04/04/19 1016     (approximate)  I have reviewed the triage vital signs and the nursing notes.   HISTORY  Chief Complaint No chief complaint on file.   HPI [name] is a 85 y.o. female recently seen for evaluation of right knee pain with significant language barrier presents the ER for persistent right knee pain.  Patient recently had arthrocentesis of the right joint which appeared consistent with inflammatory arthritis.  Cultures were sent and have been negative.  She denies any fevers.  No interval trauma.  She did not follow-up with orthopedics as discussed and recommended.  She is coming back for recurrent pain.    Past Medical History:  Diagnosis Date   Hypertension    No family history on file. History reviewed. No pertinent surgical history. There are no active problems to display for this patient.     Prior to Admission medications   Medication Sig Start Date End Date Taking? Authorizing Provider  amLODipine (NORVASC) 10 MG tablet Take 10 mg by mouth daily. 03/24/19   [provider]  azelastine (OPTIVAR) 0.05 % ophthalmic solution Place 1 drop into both eyes 2 (two) times a day. 03/26/19   [provider]  cephALEXin (KEFLEX) 500 MG capsule Take 1 capsule (500 mg total) by mouth 2 (two) times daily. Patient not taking: Reported on 03/28/2019 03/15/19   Jene EveryKinner, Robert, MD  doxycycline (VIBRAMYCIN) 100 MG capsule Take 1 capsule (100 mg total) by mouth 2 (two) times a day for 10 days. 03/28/19 04/07/19  Willy Eddyobinson, Veldon Wager, MD  furosemide (LASIX) 20 MG tablet Take 1 tablet (20 mg total) by mouth daily. Patient taking differently: Take 20 mg by mouth 2 (two) times daily.  03/15/19 03/14/20  Jene EveryKinner, Robert, MD  hydrOXYzine (ATARAX/VISTARIL) 10 MG tablet Take 10 mg by mouth 4 (four) times daily as needed for itching. 03/27/19   [provider]  lidocaine (LIDODERM) 5 % Place 1 patch onto the skin every 12 (twelve) hours. Remove & Discard patch within 12 hours or as directed by MD 04/04/19 04/03/20  Willy Eddyobinson, Haileigh Pitz, MD  potassium chloride SA (K-DUR) 20 MEQ tablet Take 20 mEq by mouth daily. 03/22/19   [provider]  triamcinolone cream (KENALOG) 0.1 % Apply 1 application topically 2 (two) times a day. 03/23/19   [provider]  VENTOLIN HFA 108 (90 Base) MCG/ACT inhaler Inhale 2 puffs into the lungs every 4 (four) hours as needed for wheezing. 03/23/19   [provider]    Allergies Patient has no known allergies.    Social History Social History   Tobacco Use   Smoking status: Never Smoker   Smokeless tobacco: Never Used  Substance Use Topics   Alcohol use: Never    Frequency: Never   Drug use: Never    Review of Systems Patient denies headaches, rhinorrhea, blurry vision, numbness, shortness of breath, chest pain, edema, cough, abdominal pain, nausea, vomiting, diarrhea, dysuria, fevers, rashes or hallucinations unless otherwise stated above in HPI. ____________________________________________   PHYSICAL EXAM:  VITAL SIGNS: Vitals:   04/04/19 1030 04/04/19 1045  BP: (!) 144/68   Pulse:  66  Resp:    Temp:    SpO2:  96%    Constitutional: Alert and oriented.  Eyes: Conjunctivae are normal.  Head: Atraumatic. Nose: No congestion/rhinnorhea. Mouth/Throat: Mucous membranes are moist.   Neck: No stridor. Painless ROM.  Cardiovascular: Normal rate, regular rhythm. Grossly normal heart sounds.  Good peripheral circulation. Respiratory: Normal respiratory effort.  No retractions. Lungs CTAB. Gastrointestinal: Soft and nontender. No distention. No abdominal bruits. No CVA tenderness. Genitourinary:  Musculoskeletal: Trace effusion of the right knee.  No overlying warmth.  No cellulitic changes.  Effusion non-tense.  No bony tenderness to palpation.  N/v intact  distally Neurologic:  Normal speech and language. No gross focal neurologic deficits are appreciated. No facial droop Skin:  Skin is warm, dry and intact. No rash noted. Psychiatric: Mood and affect are normal. Speech and behavior are normal.  ____________________________________________   LABS (all labs ordered are listed, but only abnormal results are displayed)  No results found for this or any previous visit (from the past 24 hour(s)). ____________________________________________  ____________________________________________  [id number]   ____________________________________________   PROCEDURES  Procedure(s) performed:  Procedures    Critical Care performed: no ____________________________________________   INITIAL IMPRESSION / ASSESSMENT AND PLAN / ED COURSE  Pertinent labs & imaging results that were available during my care of the patient were reviewed by me and considered in my medical decision making (see chart for details).   DDX: arthritis, inflammatory arthritis, septic arthritis  [name] is a 85 y.o. who presents to the ED with persistent right knee pain.  Does not seem consistent with septic arthritis.  Likely chronic arthritis probable inflammatory arthritis.  Do not feel that repeat imaging clinically indicated given no interval trauma.  Does not have a tense or warm effusion at this time therefore do not feel that arthrocentesis clinically indicated.  Does not seem cellulitic.  Reports that patient rubs right lower extremity throughout the day complaining of pain.  She has good peripheral perfusion.  Does not seem overtly cellulitic.  Recent ultrasound showed no evidence of DVT.  Will treat for pain. case discussed with case management we will arrange home health as well as PT and have arranged orthopedic follow-up appointment for her on Monday.  Discussed signs and symptoms which the patient should return to the ER.     The patient was evaluated in  Emergency Department today for the symptoms described in the history of present illness. He/she was evaluated in the context of the global COVID-19 pandemic, which necessitated consideration that the patient might be at risk for infection with the SARS-CoV-2 virus that causes COVID-19. Institutional protocols and algorithms that pertain to the evaluation of patients at risk for COVID-19 are in a state of rapid change based on information released by regulatory bodies including the CDC and federal and state organizations. These policies and algorithms were followed during the patient's care in the ED.  As part of my medical decision making, I reviewed the following data within the [place] notes reviewed and incorporated, Labs reviewed, notes from prior ED visits and [place] Controlled Substance Database   ____________________________________________   FINAL CLINICAL IMPRESSION(S) / ED DIAGNOSES  Final diagnoses:  Chronic pain of right knee      NEW MEDICATIONS STARTED DURING THIS VISIT:  Current Discharge Medication List       Note:  This document was prepared using Dragon voice recognition software and may include unintentional dictation errors.    Merlyn Lot, MD 04/04/19 1139

## 2019-04-04 NOTE — ED Notes (Signed)
Pt not in room when I went into to d/c her. Unable to reach by phone to let them know about discharge paperwork. Unable to leave message

## 2019-04-04 NOTE — ED Triage Notes (Signed)
C/o continued pain to right knee.  Seen through ED the first of July and leg was drained, which improved pain.  Returns to ED due to pain.  Patient is AAOx3.  Skin warm and dry. NAD

## 2019-04-09 ENCOUNTER — Other Ambulatory Visit: Payer: Self-pay

## 2019-04-09 ENCOUNTER — Other Ambulatory Visit
Admission: RE | Admit: 2019-04-09 | Discharge: 2019-04-09 | Disposition: A | Discharge status: Home / Self Care | Payer: Medicare Other | Source: Ambulatory Visit | Attending: Orthopedic Surgery | Admitting: Orthopedic Surgery

## 2019-04-09 ENCOUNTER — Ambulatory Visit
Admission: RE | Admit: 2019-04-09 | Discharge: 2019-04-09 | Disposition: A | Discharge status: Home / Self Care | Payer: Medicare Other | Source: Ambulatory Visit | Attending: Orthopedic Surgery | Admitting: Orthopedic Surgery

## 2019-04-09 ENCOUNTER — Other Ambulatory Visit: Payer: Self-pay | Admitting: Orthopedic Surgery

## 2019-04-09 DIAGNOSIS — M7989 Other specified soft tissue disorders: Secondary | ICD-10-CM

## 2019-04-09 LAB — SYNOVIAL CELL COUNT + DIFF, W/ CRYSTALS
Crystals, Fluid: NONE SEEN
Eosinophils-Synovial: 0 %
Lymphocytes-Synovial Fld: 3 %
Monocyte-Macrophage-Synovial Fluid: 4 %
Neutrophil, Synovial: 93 %
Other Cells-SYN: 0
WBC, Synovial: 28192 /mm3 — ABNORMAL HIGH (ref 0–200)

## 2019-05-09 ENCOUNTER — Emergency Department: Payer: Medicare Other

## 2019-05-09 ENCOUNTER — Other Ambulatory Visit: Payer: Self-pay

## 2019-05-09 ENCOUNTER — Encounter: Payer: Self-pay | Admitting: Emergency Medicine

## 2019-05-09 ENCOUNTER — Inpatient Hospital Stay
Admission: EM | Admit: 2019-05-09 | Discharge: 2019-05-11 | DRG: 603 | Disposition: A | Discharge status: Home / Self Care | Payer: Medicare Other | Attending: Internal Medicine | Admitting: Internal Medicine

## 2019-05-09 DIAGNOSIS — I10 Essential (primary) hypertension: Secondary | ICD-10-CM | POA: Diagnosis present

## 2019-05-09 DIAGNOSIS — M7121 Synovial cyst of popliteal space [Baker], right knee: Secondary | ICD-10-CM | POA: Diagnosis present

## 2019-05-09 DIAGNOSIS — R600 Localized edema: Secondary | ICD-10-CM | POA: Diagnosis present

## 2019-05-09 DIAGNOSIS — H409 Unspecified glaucoma: Secondary | ICD-10-CM | POA: Diagnosis present

## 2019-05-09 DIAGNOSIS — L299 Pruritus, unspecified: Secondary | ICD-10-CM | POA: Diagnosis present

## 2019-05-09 DIAGNOSIS — G8929 Other chronic pain: Secondary | ICD-10-CM | POA: Diagnosis present

## 2019-05-09 DIAGNOSIS — J45909 Unspecified asthma, uncomplicated: Secondary | ICD-10-CM | POA: Diagnosis present

## 2019-05-09 DIAGNOSIS — L0390 Cellulitis, unspecified: Secondary | ICD-10-CM | POA: Diagnosis present

## 2019-05-09 DIAGNOSIS — Z66 Do not resuscitate: Secondary | ICD-10-CM | POA: Diagnosis present

## 2019-05-09 DIAGNOSIS — Z8249 Family history of ischemic heart disease and other diseases of the circulatory system: Secondary | ICD-10-CM

## 2019-05-09 DIAGNOSIS — Z20828 Contact with and (suspected) exposure to other viral communicable diseases: Secondary | ICD-10-CM | POA: Diagnosis present

## 2019-05-09 DIAGNOSIS — L409 Psoriasis, unspecified: Secondary | ICD-10-CM | POA: Diagnosis present

## 2019-05-09 DIAGNOSIS — Z79899 Other long term (current) drug therapy: Secondary | ICD-10-CM

## 2019-05-09 DIAGNOSIS — L03116 Cellulitis of left lower limb: Secondary | ICD-10-CM | POA: Diagnosis present

## 2019-05-09 DIAGNOSIS — L03115 Cellulitis of right lower limb: Secondary | ICD-10-CM | POA: Diagnosis not present

## 2019-05-09 LAB — COMPREHENSIVE METABOLIC PANEL
ALT: 24 U/L (ref 0–44)
AST: 21 U/L (ref 15–41)
Albumin: 3.3 g/dL — ABNORMAL LOW (ref 3.5–5.0)
Alkaline Phosphatase: 75 U/L (ref 38–126)
Anion gap: 10 (ref 5–15)
BUN: 19 mg/dL (ref 8–23)
CO2: 22 mmol/L (ref 22–32)
Calcium: 8.6 mg/dL — ABNORMAL LOW (ref 8.9–10.3)
Chloride: 104 mmol/L (ref 98–111)
Creatinine, Ser: 0.93 mg/dL (ref 0.44–1.00)
GFR calc Af Amer: 59 mL/min — ABNORMAL LOW (ref >60)
GFR calc non Af Amer: 51 mL/min — ABNORMAL LOW (ref >60)
Glucose, Bld: 108 mg/dL — ABNORMAL HIGH (ref 70–99)
Potassium: 3.6 mmol/L (ref 3.5–5.1)
Sodium: 136 mmol/L (ref 135–145)
Total Bilirubin: 0.6 mg/dL (ref 0.3–1.2)
Total Protein: 6.6 g/dL (ref 6.5–8.1)

## 2019-05-09 LAB — CBC WITH DIFFERENTIAL/PLATELET
Abs Immature Granulocytes: 0.03 10*3/uL (ref 0.00–0.07)
Basophils Absolute: 0 10*3/uL (ref 0.0–0.1)
Basophils Relative: 0 %
Eosinophils Absolute: 0.7 10*3/uL — ABNORMAL HIGH (ref 0.0–0.5)
Eosinophils Relative: 11 %
HCT: 32.9 % — ABNORMAL LOW (ref 36.0–46.0)
Hemoglobin: 10.5 g/dL — ABNORMAL LOW (ref 12.0–15.0)
Immature Granulocytes: 1 %
Lymphocytes Relative: 16 %
Lymphs Abs: 1 10*3/uL (ref 0.7–4.0)
MCH: 28.7 pg (ref 26.0–34.0)
MCHC: 31.9 g/dL (ref 30.0–36.0)
MCV: 89.9 fL (ref 80.0–100.0)
Monocytes Absolute: 0.3 10*3/uL (ref 0.1–1.0)
Monocytes Relative: 4 %
Neutro Abs: 4.2 10*3/uL (ref 1.7–7.7)
Neutrophils Relative %: 68 %
Platelets: 233 10*3/uL (ref 150–400)
RBC: 3.66 MIL/uL — ABNORMAL LOW (ref 3.87–5.11)
RDW: 17.1 % — ABNORMAL HIGH (ref 11.5–15.5)
WBC: 6.1 10*3/uL (ref 4.0–10.5)
nRBC: 0 % (ref 0.0–0.2)

## 2019-05-09 LAB — SARS CORONAVIRUS 2 BY RT PCR (HOSPITAL ORDER, PERFORMED IN ~~LOC~~ HOSPITAL LAB): SARS Coronavirus 2: NEGATIVE

## 2019-05-09 MED ORDER — ONDANSETRON HCL 4 MG PO TABS: 4.0000 mg | ORAL_TABLET | Freq: Four times a day (QID) | ORAL | Status: DC | PRN

## 2019-05-09 MED ORDER — AMLODIPINE BESYLATE 10 MG PO TABS
10.0000 mg | ORAL_TABLET | Freq: Every day | ORAL | Status: DC
  Administered 2019-05-09 – 2019-05-11 (×3): 10 mg via ORAL
  Filled 2019-05-09: qty 2
  Filled 2019-05-09 (×2): qty 1

## 2019-05-09 MED ORDER — KETOTIFEN FUMARATE 0.025 % OP SOLN
1.0000 [drp] | Freq: Two times a day (BID) | OPHTHALMIC | Status: DC
  Administered 2019-05-09 – 2019-05-11 (×5): 1 [drp] via OPHTHALMIC
  Filled 2019-05-09: qty 5

## 2019-05-09 MED ORDER — ALBUTEROL SULFATE (2.5 MG/3ML) 0.083% IN NEBU: 2.5000 mg | INHALATION_SOLUTION | RESPIRATORY_TRACT | Status: DC | PRN

## 2019-05-09 MED ORDER — ACETAMINOPHEN 325 MG PO TABS: 650.0000 mg | ORAL_TABLET | Freq: Four times a day (QID) | ORAL | Status: DC | PRN

## 2019-05-09 MED ORDER — DOCUSATE SODIUM 100 MG PO CAPS
100.0000 mg | ORAL_CAPSULE | Freq: Every day | ORAL | Status: DC
  Administered 2019-05-09 – 2019-05-11 (×3): 100 mg via ORAL
  Filled 2019-05-09 (×3): qty 1

## 2019-05-09 MED ORDER — TRAMADOL HCL 50 MG PO TABS
50.0000 mg | ORAL_TABLET | Freq: Four times a day (QID) | ORAL | Status: DC | PRN
  Administered 2019-05-09: 50 mg via ORAL
  Filled 2019-05-09: qty 1

## 2019-05-09 MED ORDER — ONDANSETRON HCL 4 MG/2ML IJ SOLN: 4.0000 mg | Freq: Four times a day (QID) | INTRAMUSCULAR | Status: DC | PRN

## 2019-05-09 MED ORDER — ACETAMINOPHEN 650 MG RE SUPP
650.0000 mg | Freq: Four times a day (QID) | RECTAL | Status: DC | PRN
  Filled 2019-05-09: qty 1

## 2019-05-09 MED ORDER — VANCOMYCIN HCL IN DEXTROSE 1-5 GM/200ML-% IV SOLN
1000.0000 mg | Freq: Once | INTRAVENOUS | Status: AC
  Administered 2019-05-09: 1000 mg via INTRAVENOUS
  Filled 2019-05-09: qty 200

## 2019-05-09 MED ORDER — OXYCODONE HCL 5 MG PO TABS
5.0000 mg | ORAL_TABLET | Freq: Four times a day (QID) | ORAL | Status: DC | PRN
  Administered 2019-05-09 – 2019-05-10 (×2): 5 mg via ORAL
  Filled 2019-05-09 (×2): qty 1

## 2019-05-09 MED ORDER — SODIUM CHLORIDE 0.9 % IV SOLN
1.0000 g | INTRAVENOUS | Status: DC
  Administered 2019-05-10 – 2019-05-11 (×2): 1 g via INTRAVENOUS
  Filled 2019-05-09 (×2): qty 1
  Filled 2019-05-09: qty 10

## 2019-05-09 MED ORDER — TRIAMCINOLONE ACETONIDE 0.1 % EX CREA
1.0000 "application " | TOPICAL_CREAM | Freq: Three times a day (TID) | CUTANEOUS | Status: DC
  Administered 2019-05-09 – 2019-05-11 (×6): 1 via TOPICAL
  Filled 2019-05-09 (×3): qty 15

## 2019-05-09 MED ORDER — FUROSEMIDE 20 MG PO TABS
20.0000 mg | ORAL_TABLET | Freq: Every day | ORAL | Status: DC
  Administered 2019-05-09 – 2019-05-11 (×3): 20 mg via ORAL
  Filled 2019-05-09 (×3): qty 1

## 2019-05-09 MED ORDER — ENOXAPARIN SODIUM 30 MG/0.3ML ~~LOC~~ SOLN
30.0000 mg | SUBCUTANEOUS | Status: DC
  Administered 2019-05-09: 21:00:00 30 mg via SUBCUTANEOUS
  Filled 2019-05-09 (×3): qty 0.3

## 2019-05-09 MED ORDER — SODIUM CHLORIDE 0.9 % IV SOLN
2.0000 g | Freq: Once | INTRAVENOUS | Status: AC
  Administered 2019-05-09: 11:00:00 2 g via INTRAVENOUS
  Filled 2019-05-09: qty 20

## 2019-05-09 NOTE — Progress Notes (Signed)
Family Meeting Note  Advance Directive:yes  Today a meeting took place with the Patient.    The following clinical team members were present during this meeting:MD  The following were discussed:Patient's diagnosis: Right lower extremity cellulitis, hypertension, right-sided Baker's cyst, lower extremity edema chronic, is admitted to the hospital treatment plan of care discussed in detail with the patient with the help of interpreter.  Patient verbalized understanding of the plan.  Plan was discussed with the son as well   patient's progosis: Unable to determine and Goals for treatment: DNR  Son Mr. Chauncy Passy is the healthcare power of attorney  Additional follow-up to be provided: Hospitalist  Time spent during discussion:17 MIN  Nicholes Mango, MD

## 2019-05-09 NOTE — ED Notes (Signed)
Informed us that covid swab was negative.

## 2019-05-09 NOTE — ED Triage Notes (Addendum)
Pt sent from [facility] with c/o RT leg swelling and redness. Per family this has been going on " for a while, x2-3 months" . Redness and swelling noted. PT in NAD

## 2019-05-09 NOTE — Consult Note (Signed)
PHARMACY -  BRIEF ANTIBIOTIC NOTE   Pharmacy has received consult(s) for Vancomycin from an ED provider.  The patient's profile has been reviewed for ht/wt/allergies/indication/available labs.    One time order(s) placed for Vancomycin 1g IV x 1  Further antibiotics/pharmacy consults should be ordered by admitting physician if indicated.                       Thank you,  Lu Duffel, PharmD, BCPS Clinical Pharmacist 05/09/2019 12:01 PM

## 2019-05-09 NOTE — ED Provider Notes (Signed)
[facility] Emergency Department Provider Note    First MD Initiated Contact with Patient 05/09/19 [phone]     (approximate)  I have reviewed the triage vital signs and the nursing notes.   HISTORY  Chief Complaint Leg cellulitis   HPI [name] is a 85 y.o. female with a history of chronic right knee pain presents the ER for worsening right leg pain over the past several days.  Was initially to be seen in outpatient orthopedics clinic today but had evidence of significant cellulitic changes to the right lower extremity which her know was brought to the ER for further evaluation.  In discussion with patient's son she frequently rubs her leg due to chronic pain and has been squeezing the leg causing abrasions of the right leg which now appear infected.   History is limited due to language barrier.  Much of the history provided by patient's son.   Past Medical History:  Diagnosis Date  . Hypertension    No family history on file. History reviewed. No pertinent surgical history. There are no active problems to display for this patient.     Prior to Admission medications   Medication Sig Start Date End Date Taking? Authorizing Provider  amLODipine (NORVASC) 10 MG tablet Take 10 mg by mouth daily. 03/24/19   [provider]  azelastine (OPTIVAR) 0.05 % ophthalmic solution Place 1 drop into both eyes 2 (two) times a day. 03/26/19   [provider]  cephALEXin (KEFLEX) 500 MG capsule Take 1 capsule (500 mg total) by mouth 2 (two) times daily. Patient not taking: Reported on 03/28/2019 03/15/19   Lavonia Drafts, MD  furosemide (LASIX) 20 MG tablet Take 1 tablet (20 mg total) by mouth daily. Patient taking differently: Take 20 mg by mouth 2 (two) times daily.  03/15/19 03/14/20  Lavonia Drafts, MD  hydrOXYzine (ATARAX/VISTARIL) 10 MG tablet Take 10 mg by mouth 4 (four) times daily as needed for itching. 03/27/19   [provider]  lidocaine  (LIDODERM) 5 % Place 1 patch onto the skin every 12 (twelve) hours. Remove & Discard patch within 12 hours or as directed by MD 04/04/19 04/03/20  Merlyn Lot, MD  potassium chloride SA (K-DUR) 20 MEQ tablet Take 20 mEq by mouth daily. 03/22/19   [provider]  triamcinolone cream (KENALOG) 0.1 % Apply 1 application topically 2 (two) times a day. 03/23/19   [provider]  VENTOLIN HFA 108 (90 Base) MCG/ACT inhaler Inhale 2 puffs into the lungs every 4 (four) hours as needed for wheezing. 03/23/19   [provider]    Allergies Patient has no known allergies.    Social History Social History   Tobacco Use  . Smoking status: Never Smoker  . Smokeless tobacco: Never Used  Substance Use Topics  . Alcohol use: Never    Frequency: Never  . Drug use: Never    Review of Systems Patient denies headaches, rhinorrhea, blurry vision, numbness, shortness of breath, chest pain, edema, cough, abdominal pain, nausea, vomiting, diarrhea, dysuria, fevers, rashes or hallucinations unless otherwise stated above in HPI. ____________________________________________   PHYSICAL EXAM:  VITAL SIGNS: Vitals:   05/09/19 1130 05/09/19 1200  BP: 129/60 134/62  Pulse: 72 76  Resp:    Temp:    SpO2: 97% 97%    Constitutional: Alert and oriented.  Eyes: Conjunctivae are normal.  Head: Atraumatic. Nose: No congestion/rhinnorhea. Mouth/Throat: Mucous membranes are moist.   Neck: No stridor. Painless ROM.  Cardiovascular: Normal rate, regular rhythm. Grossly normal heart sounds.  Good peripheral circulation. Respiratory: Normal respiratory effort.  No retractions. Lungs CTAB. Gastrointestinal: Soft and nontender. No distention. No abdominal bruits. No CVA tenderness. Genitourinary: deferred Musculoskeletal: Tender erythematous right lower extremity.  Neurovascular intact distally.  Does have streaky cellulitic changes  neurologic:  Normal speech and language. No gross  focal neurologic deficits are appreciated. No facial droop Skin:  Skin is warm, dry and intact. No rash noted. Psychiatric: Mood and affect are normal. Speech and behavior are normal.  ____________________________________________   LABS (all labs ordered are listed, but only abnormal results are displayed)  Results for orders placed or performed during the hospital encounter of 05/09/19 (from the past 24 hour(s))  CBC with Differential/Platelet     Status: Abnormal   Collection Time: 05/09/19 10:41 AM  Result Value Ref Range   WBC 6.1 4.0 - 10.5 K/uL   RBC 3.66 (L) 3.87 - 5.11 MIL/uL   Hemoglobin 10.5 (L) 12.0 - 15.0 g/dL   HCT 16.132.9 (L) 09.636.0 - 04.546.0 %   MCV 89.9 80.0 - 100.0 fL   MCH 28.7 26.0 - 34.0 pg   MCHC 31.9 30.0 - 36.0 g/dL   RDW 40.917.1 (H) 81.111.5 - 91.415.5 %   Platelets 233 150 - 400 K/uL   nRBC 0.0 0.0 - 0.2 %   Neutrophils Relative % 68 %   Neutro Abs 4.2 1.7 - 7.7 K/uL   Lymphocytes Relative 16 %   Lymphs Abs 1.0 0.7 - 4.0 K/uL   Monocytes Relative 4 %   Monocytes Absolute 0.3 0.1 - 1.0 K/uL   Eosinophils Relative 11 %   Eosinophils Absolute 0.7 (H) 0.0 - 0.5 K/uL   Basophils Relative 0 %   Basophils Absolute 0.0 0.0 - 0.1 K/uL   Immature Granulocytes 1 %   Abs Immature Granulocytes 0.03 0.00 - 0.07 K/uL  Comprehensive metabolic panel     Status: Abnormal   Collection Time: 05/09/19 10:41 AM  Result Value Ref Range   Sodium 136 135 - 145 mmol/L   Potassium 3.6 3.5 - 5.1 mmol/L   Chloride 104 98 - 111 mmol/L   CO2 22 22 - 32 mmol/L   Glucose, Bld 108 (H) 70 - 99 mg/dL   BUN 19 8 - 23 mg/dL   Creatinine, Ser 7.820.93 0.44 - 1.00 mg/dL   Calcium 8.6 (L) 8.9 - 10.3 mg/dL   Total Protein 6.6 6.5 - 8.1 g/dL   Albumin 3.3 (L) 3.5 - 5.0 g/dL   AST 21 15 - 41 U/L   ALT 24 0 - 44 U/L   Alkaline Phosphatase 75 38 - 126 U/L   Total Bilirubin 0.6 0.3 - 1.2 mg/dL   GFR calc non Af Amer 51 (L) >60 mL/min   GFR calc Af Amer 59 (L) >60 mL/min   Anion gap 10 5 - 15  SARS  Coronavirus 2 [facility](Hospital order, Performed [facility] lab) Nasopharyngeal Nasopharyngeal Swab     Status: None   Collection Time: 05/09/19 10:41 AM   Specimen: Nasopharyngeal Swab  Result Value Ref Range   SARS Coronavirus 2 NEGATIVE NEGATIVE   ____________________________________________  EKG____________________________________________  RADIOLOGY  I personally reviewed all radiographic images ordered to evaluate for the above acute complaints and reviewed radiology reports and findings.  These findings were personally discussed with the patient.  Please see medical record for radiology report.  ____________________________________________   PROCEDURES  Procedure(s) performed:  Procedures  Critical Care performed: no ____________________________________________   INITIAL IMPRESSION / ASSESSMENT AND PLAN / ED COURSE  Pertinent labs & imaging results that were available during my care of the patient were reviewed by me and considered in my medical decision making (see chart for details).   DDX: Cellulitis, abscess, necrotizing soft tissue infection, DVT, abrasion, venous stasis  [name] is a 85 y.o. who presents to the ED with evidence of cellulitic changes to the right lower extremity are new since I last saw her.  She continues to frequently rub her right lower extremity due to chronic pain and is likely caused some skin breakdown.  Does have some swelling therefore ultrasound was ordered to exclude DVT.  There is a Baker's cyst noted.  She is not septic but given the extensive cellulitic changes do feel the patient will require hospitalization for IV antibiotics.     The patient was evaluated in Emergency Department today for the symptoms described in the history of present illness. He/she was evaluated in the context of the global COVID-19 pandemic, which necessitated consideration that the patient might be at risk for infection with the SARS-CoV-2 virus  that causes COVID-19. Institutional protocols and algorithms that pertain to the evaluation of patients at risk for COVID-19 are in a state of rapid change based on information released by regulatory bodies including the CDC and federal and state organizations. These policies and algorithms were followed during the patient's care in the ED.  As part of my medical decision making, I reviewed the following data within the electronic MEDICAL RECORD NUMBER Nursing notes reviewed and incorporated, Labs reviewed, notes from prior ED visits and [place] Controlled Substance Database   ____________________________________________   FINAL CLINICAL IMPRESSION(S) / ED DIAGNOSES  Final diagnoses:  Cellulitis of right lower extremity      NEW MEDICATIONS STARTED DURING THIS VISIT:  New Prescriptions   No medications on file     Note:  This document was prepared using Dragon voice recognition software and may include unintentional dictation errors.    Willy Eddyobinson, Deshanta Lady, MD 05/09/19 [phone]

## 2019-05-09 NOTE — ED Notes (Signed)
[place] called and informed this RN that they would come perform [place] after negative covid swab.

## 2019-05-09 NOTE — ED Notes (Signed)
Pt remains in US at this time.

## 2019-05-09 NOTE — ED Notes (Signed)
Admitting MD at bedside. Report to Lucina Mellow, admission holding RN.

## 2019-05-09 NOTE — ED Notes (Signed)
Pt returned from US

## 2019-05-09 NOTE — ED Notes (Signed)
Pt ambulatory to toilet with steady gait noted. Interpreter on a stick used. Pt speaks [place].

## 2019-05-09 NOTE — ED Notes (Signed)
ED TO INPATIENT HANDOFF REPORT  ED Nurse Name and Phone #: Janett Billow 27  S Name/Age/Gender [name] 85 y.o. female Room/Bed: ED33A/ED33A  Code Status   Code Status: Full Code  Home/SNF/Other Home Patient oriented to: self, place, time and situation Is this baseline? Yes   Triage Complete: Triage complete  Chief Complaint rt leg pain and swelling  Triage Note Pt sent from [facility] with c/o RT leg swelling and redness. Per family this has been going on " for a while, x2-3 months" . Redness and swelling noted. PT in NAD    Allergies No Known Allergies  Level of Care/Admitting Diagnosis ED Disposition    ED Disposition Condition [facility] Area: [place] [[id number]]  Level of Care: Med-Surg [16]  Covid Evaluation: Confirmed COVID Negative  Diagnosis: Cellulitis [009381]  Admitting Physician: Nicholes Mango [[id number]]  Attending Physician: Nicholes Mango [[id number]]  Estimated length of stay: 3 - 4 days  Certification:: I certify this patient will need inpatient services for at least 2 midnights  Bed request comments: 1c/2c  PT Class (Do Not Modify): Inpatient [101]  PT Acc Code (Do Not Modify): Private [1]       B Medical/Surgery History Past Medical History:  Diagnosis Date  . Hypertension    History reviewed. No pertinent surgical history.   A IV Location/Drains/Wounds Patient Lines/Drains/Airways Status   Active Line/Drains/Airways    Name:   Placement date:   Placement time:   Site:   Days:   Peripheral IV 05/09/19 Left;Upper Forearm   05/09/19    1059    Forearm   less than 1   Peripheral IV 05/09/19 Left Wrist   05/09/19    1059    Wrist   less than 1          Intake/Output Last 24 hours  Intake/Output Summary (Last 24 hours) at 05/09/2019 1650 Last data filed at 05/09/2019 1109 Gross per 24 hour  Intake 29.18 ml  Output -  Net 29.18 ml    Labs/Imaging Results for orders placed or performed during the hospital  encounter of 05/09/19 (from the past 48 hour(s))  CBC with Differential/Platelet     Status: Abnormal   Collection Time: 05/09/19 10:41 AM  Result Value Ref Range   WBC 6.1 4.0 - 10.5 K/uL   RBC 3.66 (L) 3.87 - 5.11 MIL/uL   Hemoglobin 10.5 (L) 12.0 - 15.0 g/dL   HCT 32.9 (L) 36.0 - 46.0 %   MCV 89.9 80.0 - 100.0 fL   MCH 28.7 26.0 - 34.0 pg   MCHC 31.9 30.0 - 36.0 g/dL   RDW 17.1 (H) 11.5 - 15.5 %   Platelets 233 150 - 400 K/uL   nRBC 0.0 0.0 - 0.2 %   Neutrophils Relative % 68 %   Neutro Abs 4.2 1.7 - 7.7 K/uL   Lymphocytes Relative 16 %   Lymphs Abs 1.0 0.7 - 4.0 K/uL   Monocytes Relative 4 %   Monocytes Absolute 0.3 0.1 - 1.0 K/uL   Eosinophils Relative 11 %   Eosinophils Absolute 0.7 (H) 0.0 - 0.5 K/uL   Basophils Relative 0 %   Basophils Absolute 0.0 0.0 - 0.1 K/uL   Immature Granulocytes 1 %   Abs Immature Granulocytes 0.03 0.00 - 0.07 K/uL    Comment: Performed at [facility], [address], [place], [place] [address]  Comprehensive metabolic panel     Status: Abnormal   Collection  Time: 05/09/19 10:41 AM  Result Value Ref Range   Sodium 136 135 - 145 mmol/L   Potassium 3.6 3.5 - 5.1 mmol/L   Chloride 104 98 - 111 mmol/L   CO2 22 22 - 32 mmol/L   Glucose, Bld 108 (H) 70 - 99 mg/dL   BUN 19 8 - 23 mg/dL   Creatinine, Ser 1.610.93 0.44 - 1.00 mg/dL   Calcium 8.6 (L) 8.9 - 10.3 mg/dL   Total Protein 6.6 6.5 - 8.1 g/dL   Albumin 3.3 (L) 3.5 - 5.0 g/dL   AST 21 15 - 41 U/L   ALT 24 0 - 44 U/L   Alkaline Phosphatase 75 38 - 126 U/L   Total Bilirubin 0.6 0.3 - 1.2 mg/dL   GFR calc non Af Amer 51 (L) >60 mL/min   GFR calc Af Amer 59 (L) >60 mL/min   Anion gap 10 5 - 15    Comment: Performed at [facility], [address] [place], [place] [address]  SARS Coronavirus 2 [facility](Hospital order, Performed in [facility] hospital lab) Nasopharyngeal Nasopharyngeal Swab     Status: None   Collection Time: 05/09/19 10:41 AM   Specimen: Nasopharyngeal Swab   Result Value Ref Range   SARS Coronavirus 2 NEGATIVE NEGATIVE    Comment: (NOTE) If result is NEGATIVE SARS-CoV-2 target nucleic acids are NOT DETECTED. The SARS-CoV-2 RNA is generally detectable in upper and lower  respiratory specimens during the acute phase of infection. The lowest  concentration of SARS-CoV-2 viral copies this assay can detect is 250  copies / mL. A negative result does not preclude SARS-CoV-2 infection  and should not be used as the sole basis for treatment or other  patient management decisions.  A negative result may occur with  improper specimen collection / handling, submission of specimen other  than nasopharyngeal swab, presence of viral mutation(s) within the  areas targeted by this assay, and inadequate number of viral copies  (<250 copies / mL). A negative result must be combined with clinical  observations, patient history, and epidemiological information. If result is POSITIVE SARS-CoV-2 target nucleic acids are DETECTED. The SARS-CoV-2 RNA is generally detectable in upper and lower  respiratory specimens dur ing the acute phase of infection.  Positive  results are indicative of active infection with SARS-CoV-2.  Clinical  correlation with patient history and other diagnostic information is  necessary to determine patient infection status.  Positive results do  not rule out bacterial infection or co-infection with other viruses. If result is PRESUMPTIVE POSTIVE SARS-CoV-2 nucleic acids MAY BE PRESENT.   A presumptive positive result was obtained on the submitted specimen  and confirmed on repeat testing.  While 2019 novel coronavirus  (SARS-CoV-2) nucleic acids may be present in the submitted sample  additional confirmatory testing may be necessary for epidemiological  and / or clinical management purposes  to differentiate between  SARS-CoV-2 and other Sarbecovirus currently known to infect humans.  If clinically indicated additional testing with  an alternate test  methodology [phone] is advised. The SARS-CoV-2 RNA is generally  detectable in upper and lower respiratory sp ecimens during the acute  phase of infection. The expected result is Negative. Fact Sheet for Patients:  BoilerBrush.com.cyhttps://www.fda.gov/media/136312/download Fact Sheet for Healthcare Providers: https://pope.com/https://www.fda.gov/media/136313/download This test is not yet approved or cleared by the [place] FDA and has been authorized for detection and/or diagnosis of SARS-CoV-2 by FDA under an Emergency Use Authorization (EUA).  This EUA will remain in effect (  meaning this test can be used) for the duration of the COVID-19 declaration under Section 564(b)(1) of the Act, 21 U.S.C. section 360bbb-3(b)(1), unless the authorization is terminated or revoked sooner. Performed at [facility], [address] [place], [place] [address]    [place] Venous Img Lower Unilateral Right  Result Date: 05/09/2019 CLINICAL DATA:  Pain and edema, erythema EXAM: RIGHT LOWER EXTREMITY VENOUS DOPPLER ULTRASOUND TECHNIQUE: Gray-scale sonography with compression, as well as color and duplex ultrasound, were performed to evaluate the deep venous system from the level of the common femoral vein through the popliteal and proximal calf veins. COMPARISON:  None FINDINGS: Normal compressibility of the common femoral, superficial femoral, and popliteal veins, as well as the proximal calf veins. No filling defects to suggest DVT on grayscale or color Doppler imaging. Doppler waveforms show normal direction of venous flow, normal respiratory phasicity and response to augmentation. Subcutaneous edema in the calf. Complex hypovascular process in the posterior popliteal fossa measuring 4.4 x 2.4 x 2.1 cm. Subcentimeter right inguinal lymph nodes incidentally noted. Survey views of the contralateral common femoral vein are unremarkable. IMPRESSION: 1. No femoropopliteal and no calf DVT in the visualized calf veins.  If clinical symptoms are inconsistent or if there are persistent or worsening symptoms, further imaging (possibly involving the iliac veins) may be warranted. 2. 4.4 cm process in the posterior popliteal fossa possibly complex Baker's cyst but nonspecific. Electronically Signed   By: Corlis Leak  Hassell M.D.   On: 05/09/2019 13:35    Pending Labs Unresulted Labs (From admission, onward)    Start     Ordered   05/16/19 0500  Creatinine, serum  (enoxaparin (LOVENOX)    CrCl < 30 ml/min)  Weekly,   STAT    Comments: while on enoxaparin therapy.    05/09/19 1455   05/10/19 0500  Basic metabolic panel  Tomorrow morning,   STAT     05/09/19 1455   05/10/19 0500  CBC  Tomorrow morning,   STAT     05/09/19 1455   05/09/19 1456  CBC  (enoxaparin (LOVENOX)    CrCl < 30 ml/min)  Once,   STAT    Comments: Baseline for enoxaparin therapy IF NOT ALREADY DRAWN.  Notify MD if PLT < 100 K.    05/09/19 1455   05/09/19 1456  Creatinine, serum  (enoxaparin (LOVENOX)    CrCl < 30 ml/min)  Once,   STAT    Comments: Baseline for enoxaparin therapy IF NOT ALREADY DRAWN.    05/09/19 1455   05/09/19 1003  Blood culture (routine x 2)  BLOOD CULTURE X 2,   STAT     05/09/19 1002          Vitals/Pain Today's Vitals   05/09/19 1140 05/09/19 1200 05/09/19 1530 05/09/19 1639  BP:  134/62 (!) 153/83   Pulse:  76 79   Resp:   18   Temp:      TempSrc:      SpO2:  97% 97%   Weight: 49.9 kg     Height: 5' (1.524 m)     PainSc:    9     Isolation Precautions No active isolations  Medications Medications  albuterol (PROVENTIL) (2.5 MG/3ML) 0.083% nebulizer solution 2.5 mg (has no administration in time range)  triamcinolone cream (KENALOG) 0.1 % 1 application (has no administration in time range)  ketotifen (ZADITOR) 0.025 % ophthalmic solution 1 drop (has no administration in time range)  enoxaparin (LOVENOX) injection 30  mg (has no administration in time range)  acetaminophen (TYLENOL) tablet 650 mg (has no  administration in time range)    Or  acetaminophen (TYLENOL) suppository 650 mg (has no administration in time range)  ondansetron (ZOFRAN) tablet 4 mg (has no administration in time range)    Or  ondansetron (ZOFRAN) injection 4 mg (has no administration in time range)  docusate sodium (COLACE) capsule 100 mg (100 mg Oral Given 05/09/19 1642)  cefTRIAXone (ROCEPHIN) 1 g in sodium chloride 0.9 % 100 mL IVPB (has no administration in time range)  amLODipine (NORVASC) tablet 10 mg (10 mg Oral Given 05/09/19 1649)  furosemide (LASIX) tablet 20 mg (20 mg Oral Given 05/09/19 1641)  traMADol (ULTRAM) tablet 50 mg (50 mg Oral Given 05/09/19 1504)  oxyCODONE (Oxy IR/ROXICODONE) immediate release tablet 5 mg (has no administration in time range)  vancomycin (VANCOCIN) IVPB 1000 mg/200 mL premix (0 mg Intravenous Stopped 05/09/19 1200)  cefTRIAXone (ROCEPHIN) 2 g in sodium chloride 0.9 % 100 mL IVPB (0 g Intravenous Stopped 05/09/19 1129)    Mobility walks Low fall risk   Focused Assessments skin assessment-right lower leg cellulitis   R Recommendations: See Admitting Provider Note  Report given to:   Additional Notes:

## 2019-05-09 NOTE — Progress Notes (Signed)
Interpreter used to translate nursing care, Patient instructed nurse via interpreter to call Son and Daughter to get Patient  history and medical information completed. Will complete needed information via son and daughter. Pt VS stable, calm and nurse will continue to monitor.

## 2019-05-09 NOTE — H&P (Signed)
[place] [facility] [place] NAME: [name]    MR#:  [id number]  DATE OF BIRTH:  March 10, 1918  DATE OF ADMISSION:  05/09/2019  PRIMARY CARE PHYSICIAN: Sharyne Peach, MD   REQUESTING/REFERRING PHYSICIAN: Quentin Cornwall MD  CHIEF COMPLAINT:  Right leg swelling and redness  HISTORY OF PRESENT ILLNESS:  [name]  is a 85 y.o. female with active essential hypertension lower extremity edema is presenting to the ED with right leg pain for several days.  Patient lives with her daughter and speaks [place] language.  Denies any insect bites or trauma.  No fever.  No sick contacts.  Patient frequently rubs her leg secondary to chronic pain.  Venous Dopplers are negative.  COVID test is negative and hospitalist team is called admit the patient.  IV Rocephin and vancomycin were given in the emergency department.  PAST MEDICAL HISTORY:   Past Medical History:  Diagnosis Date  . Hypertension     PAST SURGICAL HISTOIRY:  History reviewed. No pertinent surgical history.  SOCIAL HISTORY:   Social History   Tobacco Use  . Smoking status: Never Smoker  . Smokeless tobacco: Never Used  Substance Use Topics  . Alcohol use: Never    Frequency: Never    FAMILY HISTORY:  Essential hypertension  DRUG ALLERGIES:  No Known Allergies  REVIEW OF SYSTEMS:  CONSTITUTIONAL: No fever, fatigue or weakness.  EYES: No blurred or double vision.  EARS, NOSE, AND THROAT: No tinnitus or ear pain.  RESPIRATORY: No cough, shortness of breath, wheezing or hemoptysis.  CARDIOVASCULAR: No chest pain, orthopnea, edema.  GASTROINTESTINAL: No nausea, vomiting, diarrhea or abdominal pain.  GENITOURINARY: No dysuria, hematuria.  ENDOCRINE: No polyuria, nocturia,  HEMATOLOGY: No anemia, easy bruising or bleeding SKIN: Right leg red and swollen MUSCULOSKELETAL: No joint pain or arthritis.   NEUROLOGIC: No tingling, numbness, weakness.  PSYCHIATRY: No anxiety or  depression.   MEDICATIONS AT HOME:   Prior to Admission medications   Medication Sig Start Date End Date Taking? Authorizing Provider  amLODipine (NORVASC) 10 MG tablet Take 10 mg by mouth daily. 03/24/19  Yes [provider]  azelastine (OPTIVAR) 0.05 % ophthalmic solution Place 1 drop into both eyes 2 (two) times a day. 03/26/19  Yes [provider]  furosemide (LASIX) 20 MG tablet Take 1 tablet (20 mg total) by mouth daily. Patient taking differently: Take 20 mg by mouth 2 (two) times daily.  03/15/19 03/14/20 Yes Lavonia Drafts, MD  hydrOXYzine (ATARAX/VISTARIL) 10 MG tablet Take 10 mg by mouth 4 (four) times daily as needed for itching. 03/27/19  Yes [provider]  lidocaine (LIDODERM) 5 % Place 1 patch onto the skin every 12 (twelve) hours. Remove & Discard patch within 12 hours or as directed by MD 04/04/19 04/03/20 Yes Merlyn Lot, MD  potassium chloride SA (K-DUR) 20 MEQ tablet Take 20 mEq by mouth daily. 03/22/19  Yes [provider]  triamcinolone cream (KENALOG) 0.1 % Apply 1 application topically 2 (two) times a day. 03/23/19  Yes [provider]  cephALEXin (KEFLEX) 500 MG capsule Take 1 capsule (500 mg total) by mouth 2 (two) times daily. Patient not taking: Reported on 03/28/2019 03/15/19   Lavonia Drafts, MD  VENTOLIN HFA 108 [phone] Base) MCG/ACT inhaler Inhale 2 puffs into the lungs every 4 (four) hours as needed for wheezing. 03/23/19   [provider]      VITAL SIGNS:  Blood pressure 134/62, pulse 76, temperature  98.1 F (36.7 C), temperature source Oral, resp. rate 16, height 5' (1.524 m), weight 49.9 kg, SpO2 97 %.  PHYSICAL EXAMINATION:  GENERAL:  85 y.o.-year-old patient lying in the bed with no acute distress.  EYES: Pupils equal, round, reactive to light and accommodation. No scleral icterus. Extraocular muscles intact.  HEENT: Head atraumatic, normocephalic. Oropharynx and nasopharynx clear.  NECK:  Supple, no jugular  venous distention. No thyroid enlargement, no tenderness.  LUNGS: Normal breath sounds bilaterally, no wheezing, rales,rhonchi or crepitation. No use of accessory muscles of respiration.  CARDIOVASCULAR: S1, S2 normal. No murmurs, rubs, or gallops.  ABDOMEN: Soft, nontender, nondistended. Bowel sounds present. EXTREMITIES: Right lower extremity is erythematous, edematous NEUROLOGIC: Cranial nerves II through XII are intact. Muscle strength global weakness in all extremities. Sensation intact. Gait not checked.  PSYCHIATRIC: The patient is alert and oriented x 3.  SKIN: No obvious rash, lesion, or ulcer.   LABORATORY PANEL:   CBC Recent Labs  Lab 05/09/19 1041  WBC 6.1  HGB 10.5*  HCT 32.9*  PLT 233   ------------------------------------------------------------------------------------------------------------------  Chemistries  Recent Labs  Lab 05/09/19 1041  NA 136  K 3.6  CL 104  CO2 22  GLUCOSE 108*  BUN 19  CREATININE 0.93  CALCIUM 8.6*  AST 21  ALT 24  ALKPHOS 75  BILITOT 0.6   ------------------------------------------------------------------------------------------------------------------  Cardiac Enzymes No results for input(s): TROPONINI in the last 168 hours. ------------------------------------------------------------------------------------------------------------------  RADIOLOGY:  [place] Venous Img Lower Unilateral Right  Result Date: 05/09/2019 CLINICAL DATA:  Pain and edema, erythema EXAM: RIGHT LOWER EXTREMITY VENOUS DOPPLER ULTRASOUND TECHNIQUE: Gray-scale sonography with compression, as well as color and duplex ultrasound, were performed to evaluate the deep venous system from the level of the common femoral vein through the popliteal and proximal calf veins. COMPARISON:  None FINDINGS: Normal compressibility of the common femoral, superficial femoral, and popliteal veins, as well as the proximal calf veins. No filling defects to suggest DVT on grayscale or  color Doppler imaging. Doppler waveforms show normal direction of venous flow, normal respiratory phasicity and response to augmentation. Subcutaneous edema in the calf. Complex hypovascular process in the posterior popliteal fossa measuring 4.4 x 2.4 x 2.1 cm. Subcentimeter right inguinal lymph nodes incidentally noted. Survey views of the contralateral common femoral vein are unremarkable. IMPRESSION: 1. No femoropopliteal and no calf DVT in the visualized calf veins. If clinical symptoms are inconsistent or if there are persistent or worsening symptoms, further imaging (possibly involving the iliac veins) may be warranted. 2. 4.4 cm process in the posterior popliteal fossa possibly complex Baker's cyst but nonspecific. Electronically Signed   By: Corlis Leak  Hassell M.D.   On: 05/09/2019 13:35    EKG:   Orders placed or performed during the hospital encounter of 03/15/19  . ED EKG 12-Lead  . ED EKG 12-Lead  . EKG 12-Lead  . EKG 12-Lead  . EKG 12-Lead  . EKG 12-Lead    IMPRESSION AND PLAN:   #Right lower extremity cellulitis Admit to MedSurg unit IV Rocephin and vancomycin were given in the emergency department we will continue IV Rocephin Keep the leg elevated by 2 pillows  Lasix for lower extremity edema Venous Dopplers negative for DVT  #Essential hypertension-Norvasc  #Lower extremity edema Lasix will be continued  #Right leg Baker's cyst-no interventions needed at this time pain meds as needed    All the records are reviewed and case discussed with ED provider. Management plans discussed with the patient, son Shayne AlkenLam and  they are in agreement.  CODE STATUS: DNR   TOTAL TIME TAKING CARE OF THIS PATIENT: 45  minutes.   Note: This dictation was prepared with Dragon dictation along with smaller phrase technology. Any transcriptional errors that result from this process are unintentional.  Ramonita LabAruna Altamese Deguire M.D on 05/09/2019 at 2:40 PM  Between 7am to 6pm - Pager - [phone]  After 6pm  go to [email or web address] - password [email or web address] [facility]  [facility]  Office  [phone]  CC: Primary care physician; Rayetta HumphreyGeorge, Sionne A, MD

## 2019-05-09 NOTE — ED Notes (Signed)
MD notified of pt still being in pain.

## 2019-05-10 LAB — BASIC METABOLIC PANEL
Anion gap: 6 (ref 5–15)
BUN: 14 mg/dL (ref 8–23)
CO2: 26 mmol/L (ref 22–32)
Calcium: 8.2 mg/dL — ABNORMAL LOW (ref 8.9–10.3)
Chloride: 106 mmol/L (ref 98–111)
Creatinine, Ser: 0.71 mg/dL (ref 0.44–1.00)
GFR calc Af Amer: >60 mL/min (ref >60)
GFR calc non Af Amer: >60 mL/min (ref >60)
Glucose, Bld: 105 mg/dL — ABNORMAL HIGH (ref 70–99)
Potassium: 3.7 mmol/L (ref 3.5–5.1)
Sodium: 138 mmol/L (ref 135–145)

## 2019-05-10 LAB — CBC
HCT: 28.3 % — ABNORMAL LOW (ref 36.0–46.0)
Hemoglobin: 9.1 g/dL — ABNORMAL LOW (ref 12.0–15.0)
MCH: 28.9 pg (ref 26.0–34.0)
MCHC: 32.2 g/dL (ref 30.0–36.0)
MCV: 89.8 fL (ref 80.0–100.0)
Platelets: 229 10*3/uL (ref 150–400)
RBC: 3.15 MIL/uL — ABNORMAL LOW (ref 3.87–5.11)
RDW: 17.1 % — ABNORMAL HIGH (ref 11.5–15.5)
WBC: 4.7 10*3/uL (ref 4.0–10.5)
nRBC: 0 % (ref 0.0–0.2)

## 2019-05-10 LAB — C-REACTIVE PROTEIN: CRP: 8.6 mg/dL — ABNORMAL HIGH (ref <1.0)

## 2019-05-10 LAB — SEDIMENTATION RATE: Sed Rate: 70 mm/hr — ABNORMAL HIGH (ref 0–30)

## 2019-05-10 NOTE — Care Management Important Message (Signed)
Important Message  Patient Details  Name: [name] MRN: [id number] Date of Birth: August 09, 1918   Medicare Important Message Given:  Yes  Initial Medicare IM given by Patient Access Associate on 05/10/2019 at 10:39am.     Dannette Barbara 05/10/2019, 7:03 PM

## 2019-05-10 NOTE — Progress Notes (Signed)
[facility] [place] at [facility]   PATIENT NAME: [name]    MR#:  [id number]  DATE OF BIRTH:  09/30/1917  SUBJECTIVE:  CHIEF COMPLAINT:  No chief complaint on file.  - has right lower extremity chronic skin changes with dry skin but worsening swelling and skin breakdown in the last week. -Being treated for cellulitis.  Speaks [place] language, son at bedside for interpretation  REVIEW OF SYSTEMS:  Review of Systems  Constitutional: Negative for chills, fever and malaise/fatigue.  HENT: Negative for congestion, ear discharge, hearing loss and nosebleeds.   Eyes: Negative for blurred vision and double vision.  Respiratory: Negative for cough, shortness of breath and wheezing.   Cardiovascular: Negative for chest pain and palpitations.  Gastrointestinal: Negative for abdominal pain, constipation, diarrhea, nausea and vomiting.  Genitourinary: Negative for dysuria.  Musculoskeletal: Negative for myalgias.  Skin: Positive for rash.  Neurological: Negative for dizziness, focal weakness, seizures, weakness and headaches.  Psychiatric/Behavioral: Negative for depression.    DRUG ALLERGIES:  No Known Allergies  VITALS:  Blood pressure 130/68, pulse 76, temperature 98.6 F (37 C), temperature source Oral, resp. rate 14, height 5' (1.524 m), weight 49.9 kg, SpO2 98 %.  PHYSICAL EXAMINATION:  Physical Exam   GENERAL:  [age] y.o.-year-old patient lying in the bed with no acute distress.  EYES: Pupils equal, round, reactive to light and accommodation. No scleral icterus. Extraocular muscles intact.  HEENT: Head atraumatic, normocephalic. Oropharynx and nasopharynx clear.  NECK:  Supple, no jugular venous distention. No thyroid enlargement, no tenderness.  LUNGS: Normal breath sounds bilaterally, no wheezing, rales,rhonchi or crepitation. No use of accessory muscles of respiration.  Decreased bibasilar breath sounds CARDIOVASCULAR: S1, S2 normal. No  rubs, or  gallops. 2/6 systolic murmur present ABDOMEN: Soft, nontender, nondistended. Bowel sounds present. No organomegaly or mass.  EXTREMITIES: No  cyanosis, or clubbing. 2+ right lower extremity edema, with erythema and skin breakdown NEUROLOGIC: Cranial nerves II through XII are intact. Muscle strength 5/5 in all extremities. Sensation intact. Gait not checked.  PSYCHIATRIC: The patient is alert and oriented x 3.  SKIN: No obvious rash, lesion, or ulcer.        LABORATORY PANEL:   CBC Recent Labs  Lab 05/10/19 0444  WBC 4.7  HGB 9.1*  HCT 28.3*  PLT 229   ------------------------------------------------------------------------------------------------------------------  Chemistries  Recent Labs  Lab 05/09/19 1041 05/10/19 0444  NA 136 138  K 3.6 3.7  CL 104 106  CO2 22 26  GLUCOSE 108* 105*  BUN 19 14  CREATININE 0.93 0.71  CALCIUM 8.6* 8.2*  AST 21  --   ALT 24  --   ALKPHOS 75  --   BILITOT 0.6  --    ------------------------------------------------------------------------------------------------------------------  Cardiac Enzymes No results for input(s): TROPONINI in the last 168 hours. ------------------------------------------------------------------------------------------------------------------  RADIOLOGY:  [place] Venous Img Lower Unilateral Right  Result Date: 05/09/2019 CLINICAL DATA:  Pain and edema, erythema EXAM: RIGHT LOWER EXTREMITY VENOUS DOPPLER ULTRASOUND TECHNIQUE: Gray-scale sonography with compression, as well as color and duplex ultrasound, were performed to evaluate the deep venous system from the level of the common femoral vein through the popliteal and proximal calf veins. COMPARISON:  None FINDINGS: Normal compressibility of the common femoral, superficial femoral, and popliteal veins, as well as the proximal calf veins. No filling defects to suggest DVT on grayscale or color Doppler imaging. Doppler waveforms show normal direction of venous flow,  normal respiratory phasicity and response to augmentation. Subcutaneous  edema in the calf. Complex hypovascular process in the posterior popliteal fossa measuring 4.4 x 2.4 x 2.1 cm. Subcentimeter right inguinal lymph nodes incidentally noted. Survey views of the contralateral common femoral vein are unremarkable. IMPRESSION: 1. No femoropopliteal and no calf DVT in the visualized calf veins. If clinical symptoms are inconsistent or if there are persistent or worsening symptoms, further imaging (possibly involving the iliac veins) may be warranted. 2. 4.4 cm process in the posterior popliteal fossa possibly complex Baker's cyst but nonspecific. Electronically Signed   By: Lucrezia Europe M.D.   On: 05/09/2019 13:35    EKG:   Orders placed or performed during the hospital encounter of 03/15/19  . ED EKG 12-Lead  . ED EKG 12-Lead  . EKG 12-Lead  . EKG 12-Lead  . EKG 12-Lead  . EKG 12-Lead    ASSESSMENT AND PLAN:   85 year old female with past medical history significant for hypertension, chronic right leg skin changes presents to hospital secondary to swelling of the right leg.  1.  Right lower extremity cellulitis-has chronic dry skin, pruritic and causing skin breakdown -Being covered with IV antibiotics. -Keep the leg elevated.  Still pretty erythematous.  Continue IV antibiotics for another day -Will need dermatology follow-up as outpatient for her chronic skin condition.  2.  Hypertension-on Norvasc, low-dose Lasix  3.  Glaucoma-continue eyedrops  4.  DVT prophylaxis-Lovenox  Patient is independent and ambulatory -Son at bedside     All the records are reviewed and case discussed with Care Management/Social Workerr. Management plans discussed with the patient, family and they are in agreement.  CODE STATUS: Full Code  TOTAL TIME TAKING CARE OF THIS PATIENT: 38 minutes.   POSSIBLE D/C IN 1-2 DAYS, DEPENDING ON CLINICAL CONDITION.   Gladstone Lighter M.D on 05/10/2019 at 4:46  PM  Between 7am to 6pm - Pager - [phone]  After 6pm go to [email or web address] - password [email or web address] [facility]  Office  [phone]  CC: Primary care physician; Sharyne Peach, MD

## 2019-05-11 LAB — ANA COMPREHENSIVE PANEL

## 2019-05-11 MED ORDER — FUROSEMIDE 20 MG PO TABS: 20.0000 mg | ORAL_TABLET | Freq: Every day | ORAL | 1 refills | Status: DC | PRN

## 2019-05-11 MED ORDER — CEPHALEXIN 500 MG PO CAPS
500.0000 mg | ORAL_CAPSULE | Freq: Three times a day (TID) | ORAL | Status: DC
  Administered 2019-05-11: 500 mg via ORAL
  Filled 2019-05-11: qty 1

## 2019-05-11 MED ORDER — CEPHALEXIN 500 MG PO CAPS: 500.0000 mg | ORAL_CAPSULE | Freq: Three times a day (TID) | ORAL | 0 refills | Status: AC

## 2019-05-11 MED ORDER — POTASSIUM CHLORIDE CRYS ER 20 MEQ PO TBCR: 20.0000 meq | EXTENDED_RELEASE_TABLET | Freq: Every day | ORAL | 1 refills | Status: DC | PRN

## 2019-05-11 MED ORDER — HYDROXYZINE HCL 10 MG PO TABS
10.0000 mg | ORAL_TABLET | Freq: Three times a day (TID) | ORAL | Status: DC | PRN
  Filled 2019-05-11: qty 1

## 2019-05-11 NOTE — Discharge Summary (Signed)
[facility] - [place] at [facility]   PATIENT NAME: [name]    MR#:  [id number]  DATE OF BIRTH:  01/24/1918  DATE OF ADMISSION:  05/09/2019   ADMITTING PHYSICIAN: Ramonita LabAruna Gouru, MD  DATE OF DISCHARGE:  05/11/19  PRIMARY CARE PHYSICIAN: Rayetta HumphreyGeorge, Sionne A, MD   ADMISSION DIAGNOSIS:   Cellulitis of right lower extremity [L03.115]  DISCHARGE DIAGNOSIS:   Active Problems:   Cellulitis   SECONDARY DIAGNOSIS:   Past Medical History:  Diagnosis Date  . Hypertension     HOSPITAL COURSE:   85 year old female who is very active for her age, with past medical history significant for hypertension, chronic right leg skin changes presents to hospital secondary to swelling of the right leg.  1.  Right lower extremity cellulitis-has chronic dry skin, pruritic and causing skin breakdown -patient received IV antibiotics here in the hospital with vancomycin and Rocephin.  She has likely psoriasis on her right knee and scratches quite a bit causing skin breakdown. -Improved with antibiotics, being discharged on Keflex.  Advised to keep the leg elevated and also dressing changes recommended with Xeroform and dry gauze. Dressing procedure/placement/frequency: Moisture retentive dressing with antimicrobial properties (Xeroform) is Ordered for Nursing to perform daily and PRN for loosening of dressing. Heels are to be floated to decrease the rick for pressure injury to heels -RN has shown patient's son at bedside how to do the Xeroform dressing changes. -Will need dermatology follow-up as outpatient for her chronic skin condition.  Appointment will be given prior to discharge.  2.  Hypertension-on Norvasc, low-dose Lasix as needed  3.  Glaucoma-continue eyedrops  4.    Asthma-at baseline.  Continue as needed inhaler  Patient is independent and ambulatory -Son at bedside and given discharge plan.  DISCHARGE CONDITIONS:   Guarded  CONSULTS OBTAINED:   None  DRUG  ALLERGIES:   No Known Allergies DISCHARGE MEDICATIONS:   Allergies as of 05/11/2019   No Known Allergies     Medication List    TAKE these medications   amLODipine 10 MG tablet Commonly known as: NORVASC Take 10 mg by mouth daily.   azelastine 0.05 % ophthalmic solution Commonly known as: OPTIVAR Place 1 drop into both eyes 2 (two) times a day.   cephALEXin 500 MG capsule Commonly known as: KEFLEX Take 1 capsule (500 mg total) by mouth every 8 (eight) hours for 10 days. What changed: when to take this   furosemide 20 MG tablet Commonly known as: Lasix Take 1 tablet (20 mg total) by mouth daily as needed for fluid or edema. What changed:   when to take this  reasons to take this   hydrOXYzine 10 MG tablet Commonly known as: ATARAX/VISTARIL Take 10 mg by mouth 4 (four) times daily as needed for itching.   lidocaine 5 % Commonly known as: Lidoderm Place 1 patch onto the skin every 12 (twelve) hours. Remove & Discard patch within 12 hours or as directed by MD   potassium chloride SA 20 MEQ tablet Commonly known as: K-DUR Take 1 tablet (20 mEq total) by mouth daily as needed (only on the days when taking lasix). What changed:   when to take this  reasons to take this   triamcinolone cream 0.1 % Commonly known as: KENALOG Apply 1 application topically 2 (two) times a day.   Ventolin HFA 108 (90 Base) MCG/ACT inhaler Generic drug: albuterol Inhale 2 puffs into the lungs every 4 (four) hours as needed for  wheezing.        DISCHARGE INSTRUCTIONS:   1.  PCP follow-up in 1 to 2 weeks 2.  Dermatology follow-up in 1 to 2 weeks  DIET:   Cardiac diet  ACTIVITY:   Activity as tolerated  OXYGEN:   Home Oxygen: No.  Oxygen Delivery: room air  DISCHARGE LOCATION:   home   If you experience worsening of your admission symptoms, develop shortness of breath, life threatening emergency, suicidal or homicidal thoughts you must seek medical attention  immediately by calling 911 or calling your MD immediately  if symptoms less severe.  You Must read complete instructions/literature along with all the possible adverse reactions/side effects for all the Medicines you take and that have been prescribed to you. Take any new Medicines after you have completely understood and accpet all the possible adverse reactions/side effects.   Please note  You were cared for by a hospitalist during your hospital stay. If you have any questions about your discharge medications or the care you received while you were in the hospital after you are discharged, you can call the unit and asked to speak with the hospitalist on call if the hospitalist that took care of you is not available. Once you are discharged, your primary care physician will handle any further medical issues. Please note that NO REFILLS for any discharge medications will be authorized once you are discharged, as it is imperative that you return to your primary care physician (or establish a relationship with a primary care physician if you do not have one) for your aftercare needs so that they can reassess your need for medications and monitor your lab values.    On the day of Discharge:  VITAL SIGNS:   Blood pressure (!) 147/68, pulse 70, temperature 98.4 F (36.9 C), resp. rate 14, height 5' (1.524 m), weight 49.9 kg, SpO2 94 %.  PHYSICAL EXAMINATION:    GENERAL:  85 y.o.-year-old patient lying in the bed with no acute distress.  EYES: Pupils equal, round, reactive to light and accommodation. No scleral icterus. Extraocular muscles intact.  HEENT: Head atraumatic, normocephalic. Oropharynx and nasopharynx clear.  NECK:  Supple, no jugular venous distention. No thyroid enlargement, no tenderness.  LUNGS: Normal breath sounds bilaterally, no wheezing, rales,rhonchi or crepitation. No use of accessory muscles of respiration.  Decreased bibasilar breath sounds CARDIOVASCULAR: S1, S2 normal. No   rubs, or gallops. 2/6 systolic murmur present ABDOMEN: Soft, nontender, nondistended. Bowel sounds present. No organomegaly or mass.  EXTREMITIES: No  cyanosis, or clubbing.  Improving 2+ right lower extremity edema, with erythema and skin breakdown NEUROLOGIC: Cranial nerves II through XII are intact. Muscle strength 5/5 in all extremities. Sensation intact. Gait not checked.  PSYCHIATRIC: The patient is alert and oriented x 3.  SKIN: No obvious rash, lesion, or ulcer.      DATA REVIEW:   CBC Recent Labs  Lab 05/10/19 0444  WBC 4.7  HGB 9.1*  HCT 28.3*  PLT 229    Chemistries  Recent Labs  Lab 05/09/19 1041 05/10/19 0444  NA 136 138  K 3.6 3.7  CL 104 106  CO2 22 26  GLUCOSE 108* 105*  BUN 19 14  CREATININE 0.93 0.71  CALCIUM 8.6* 8.2*  AST 21  --   ALT 24  --   ALKPHOS 75  --   BILITOT 0.6  --      Microbiology Results  Results for orders placed or performed during the hospital encounter of 05/09/19  SARS Coronavirus 2 [facility](Hospital order, Performed in [facility] hospital lab) Nasopharyngeal Nasopharyngeal Swab     Status: None   Collection Time: 05/09/19 10:41 AM   Specimen: Nasopharyngeal Swab  Result Value Ref Range Status   SARS Coronavirus 2 NEGATIVE NEGATIVE Final    Comment: (NOTE) If result is NEGATIVE SARS-CoV-2 target nucleic acids are NOT DETECTED. The SARS-CoV-2 RNA is generally detectable in upper and lower  respiratory specimens during the acute phase of infection. The lowest  concentration of SARS-CoV-2 viral copies this assay can detect is 250  copies / mL. A negative result does not preclude SARS-CoV-2 infection  and should not be used as the sole basis for treatment or other  patient management decisions.  A negative result may occur with  improper specimen collection / handling, submission of specimen other  than nasopharyngeal swab, presence of viral mutation(s) within the  areas targeted by this assay, and inadequate number of viral  copies  (<250 copies / mL). A negative result must be combined with clinical  observations, patient history, and epidemiological information. If result is POSITIVE SARS-CoV-2 target nucleic acids are DETECTED. The SARS-CoV-2 RNA is generally detectable in upper and lower  respiratory specimens dur ing the acute phase of infection.  Positive  results are indicative of active infection with SARS-CoV-2.  Clinical  correlation with patient history and other diagnostic information is  necessary to determine patient infection status.  Positive results do  not rule out bacterial infection or co-infection with other viruses. If result is PRESUMPTIVE POSTIVE SARS-CoV-2 nucleic acids MAY BE PRESENT.   A presumptive positive result was obtained on the submitted specimen  and confirmed on repeat testing.  While 2019 novel coronavirus  (SARS-CoV-2) nucleic acids may be present in the submitted sample  additional confirmatory testing may be necessary for epidemiological  and / or clinical management purposes  to differentiate between  SARS-CoV-2 and other Sarbecovirus currently known to infect humans.  If clinically indicated additional testing with an alternate test  methodology [phone]([id number]) is advised. The SARS-CoV-2 RNA is generally  detectable in upper and lower respiratory sp ecimens during the acute  phase of infection. The expected result is Negative. Fact Sheet for Patients:  BoilerBrush.com.cyhttps://www.fda.gov/media/136312/download Fact Sheet for Healthcare Providers: https://pope.com/https://www.fda.gov/media/136313/download This test is not yet approved or cleared by the [place] FDA and has been authorized for detection and/or diagnosis of SARS-CoV-2 by FDA under an Emergency Use Authorization (EUA).  This EUA will remain in effect (meaning this test can be used) for the duration of the COVID-19 declaration under Section 564(b)(1) of the Act, 21 U.S.C. section 360bbb-3(b)(1), unless the authorization is terminated  or revoked sooner. Performed at [facility], [address] [place], [place] [address]   Blood culture (routine x 2)     Status: None (Preliminary result)   Collection Time: 05/09/19 10:42 AM   Specimen: BLOOD  Result Value Ref Range Status   Specimen Description BLOOD LEFT WRIST  Final   Special Requests   Final    BOTTLES DRAWN AEROBIC AND ANAEROBIC Blood Culture adequate volume   Culture   Final    NO GROWTH 2 DAYS Performed at [facility], [address] [place], [place] [address]    Report Status PENDING  Incomplete  Blood culture (routine x 2)     Status: None (Preliminary result)   Collection Time: 05/09/19 10:42 AM   Specimen: BLOOD  Result Value Ref Range Status   Specimen Description BLOOD BLOOD  LEFT FOREARM  Final   Special Requests   Final    BOTTLES DRAWN AEROBIC AND ANAEROBIC Blood Culture results may not be optimal due to an excessive volume of blood received in culture bottles   Culture   Final    NO GROWTH 2 DAYS Performed at [facility], [address], [place], [place] [address]    Report Status PENDING  Incomplete    RADIOLOGY:  No results found.   Management plans discussed with the patient, family and they are in agreement.  CODE STATUS:     Code Status Orders  (From admission, onward)         Start     Ordered   05/09/19 1456  Full code  Continuous     05/09/19 1455        Code Status History    Date Active Date Inactive Code Status Order ID Comments User Context   05/09/2019 6045 05/09/2019 1455 DNR [id number]  Nicholes Mango, MD ED   Advance Care Planning Activity      TOTAL TIME TAKING CARE OF THIS PATIENT: 38 minutes.    Gladstone Lighter M.D on 05/11/2019 at 2:34 PM  Between 7am to 6pm - Pager - [phone]  After 6pm go to [email or web address] - Proofreader  [facility]  Office  [phone]  CC: Primary care physician; Sharyne Peach, MD   Note: This  dictation was prepared with Dragon dictation along with smaller phrase technology. Any transcriptional errors that result from this process are unintentional.

## 2019-05-11 NOTE — Progress Notes (Addendum)
Patient refuses bed alarm. Patient educated on bed alarm and purpose using interpreter tablet.

## 2019-05-11 NOTE — Consult Note (Signed)
[facility] Nurse wound consult note Reason for Consult: Right LE cellulitis with two wounds Wound type:Infectious Pressure Injury POA: N/A Measurement: Right lateral knee: 3cm x 3.4cm x 0.1cm pink, dry wound bed Right pretibial area: 1.4cm x 0.8cm x 0.2cm with dry pink and yellow wound bed Wound bed:AS described above Drainage (amount, consistency, odor)  Periwound: Dressing procedure/placement/frequency: Moisture retentive dressing with antimicrobial properties (Xeroform) is Ordered for Nursing to perform daily and PRN for loosening of dressing. Heels are to be floated to decrease the rick for pressure injury to heels.  [place] nursing team will not follow, but will remain available to this patient, the nursing and medical teams.  Please re-consult if needed. Thanks, Maudie Flakes, MSN, RN, [place], Arther Abbott  Pager# [phone]

## 2019-05-11 NOTE — Progress Notes (Signed)
Patient discharged home per MD order. All discharge instructions given to son over telephone and all questions answered. Prescriptions given to patient. Interpreter tablet used for discharge instructions to patient.

## 2019-05-14 LAB — CULTURE, BLOOD (ROUTINE X 2)
Culture: NO GROWTH
Culture: NO GROWTH
Special Requests: ADEQUATE

## 2019-08-12 IMAGING — US RIGHT LOWER EXTREMITY VENOUS ULTRASOUND
1 series · 13 of 24 positions shown · non-contrast
Comparison: None.

CLINICAL DATA: Right lower extremity edema.



[Series 1: right lower extremity venous ultrasound · 0.05mm/px · 44 acquisitions, 13 frames shown]
[im 1/44]
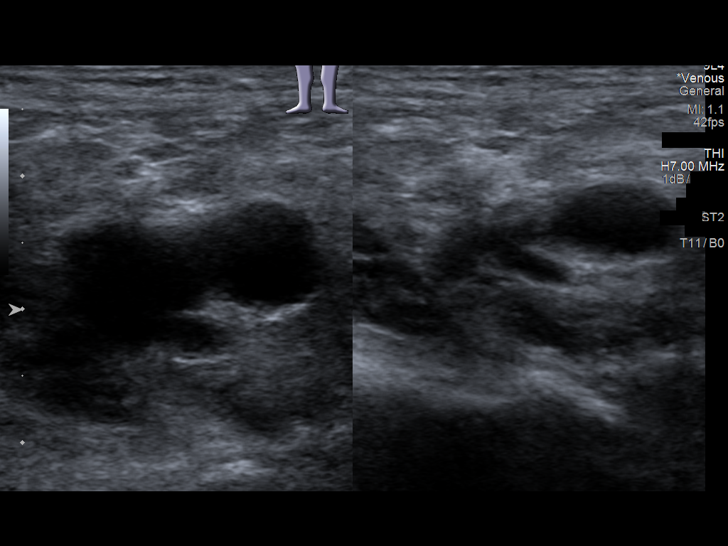
[im 4/44]
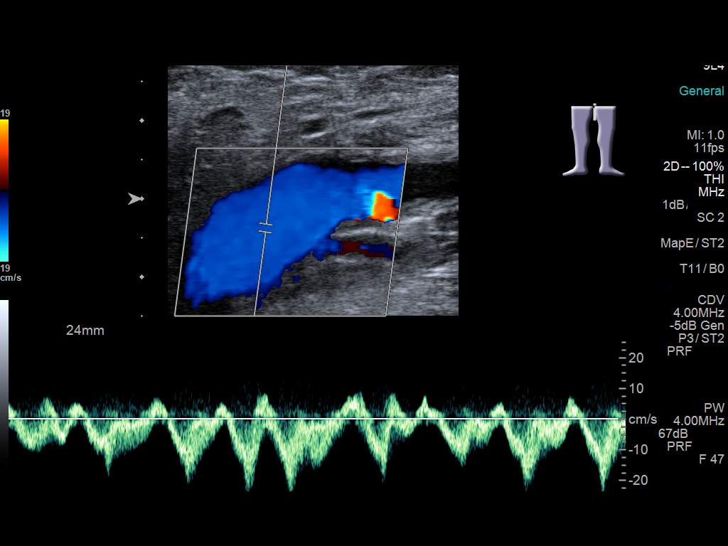
[im 8/44]
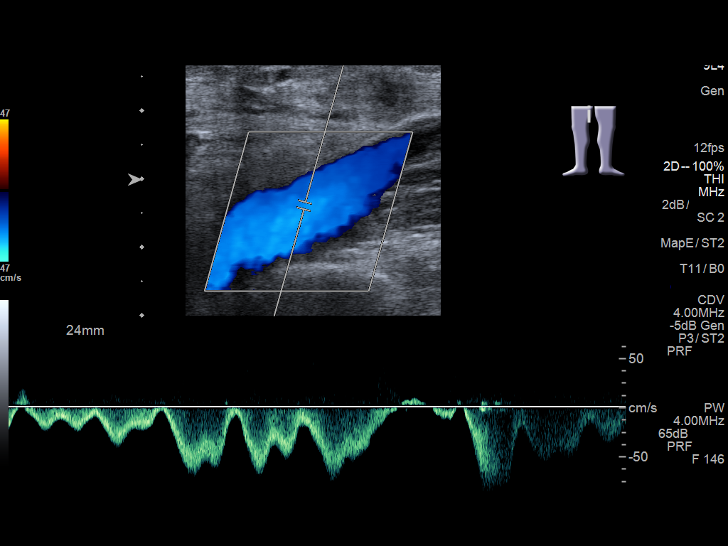
[im 12/44]
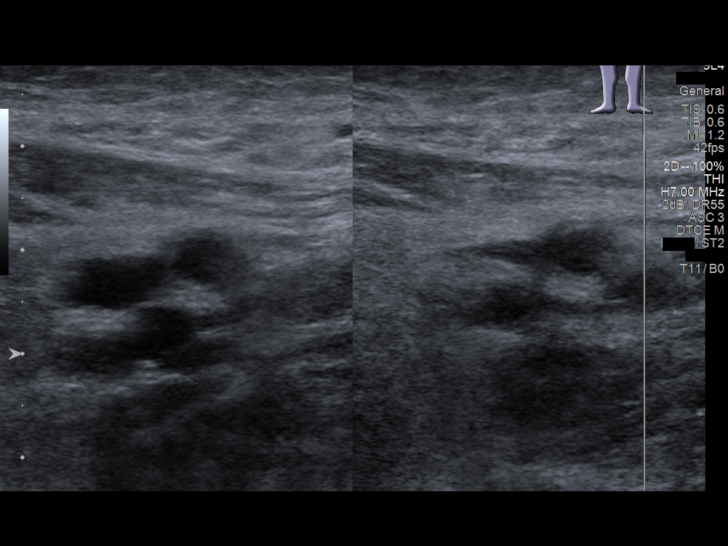
[im 15/44]
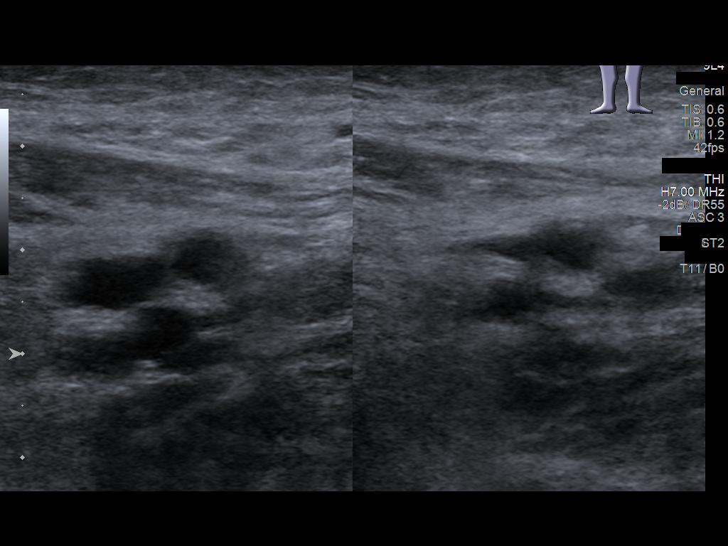
[im 19/44]
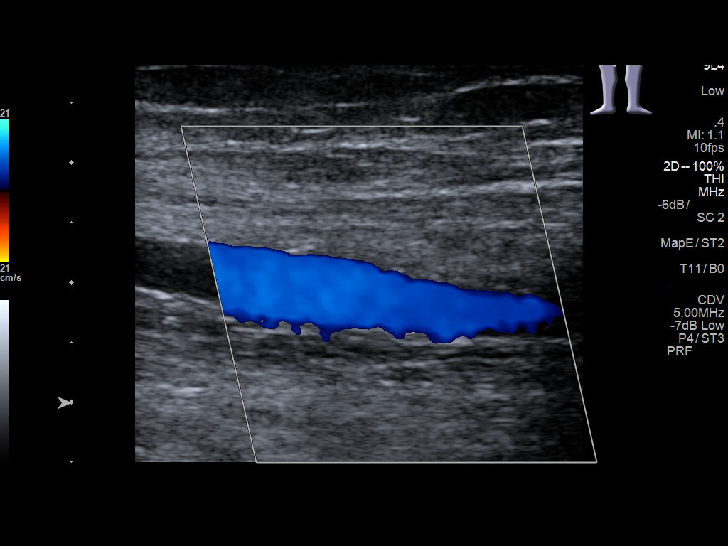
[im 25/44]
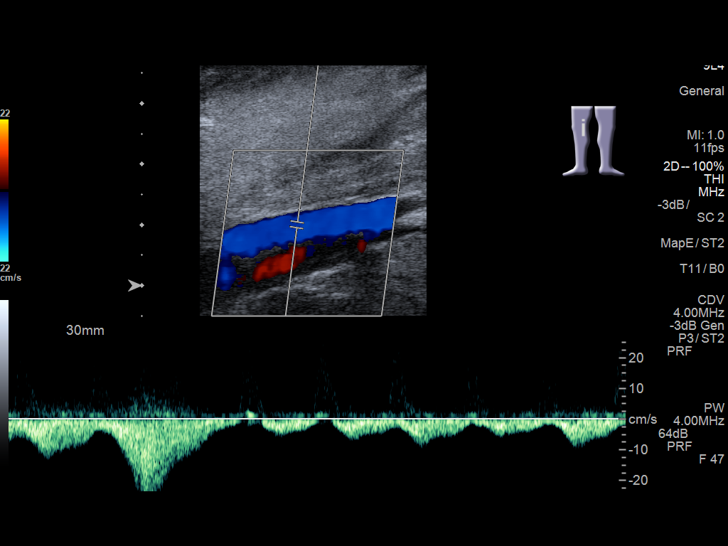
[im 27/44]
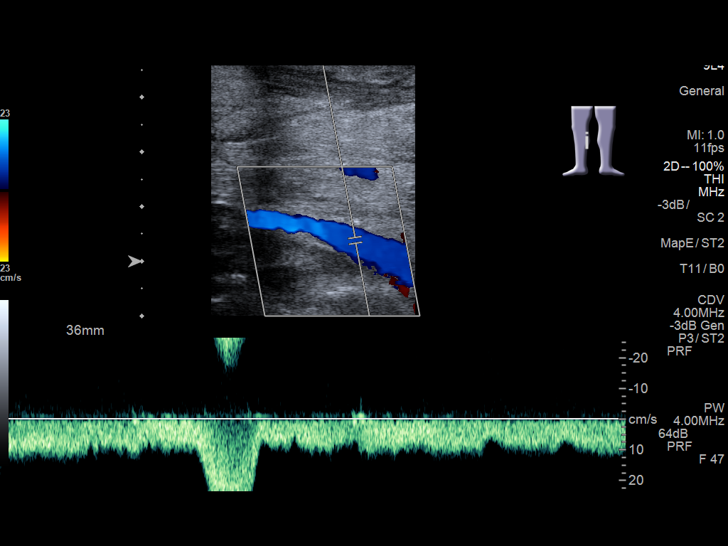
[im 30/44]
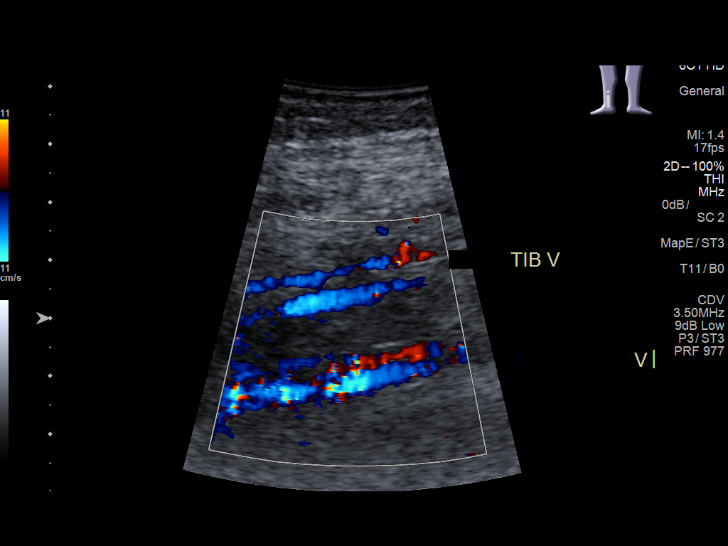
[im 34/44]
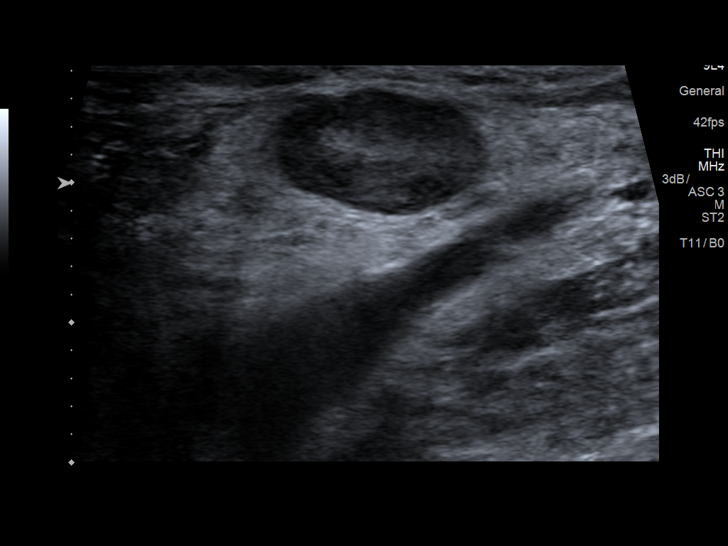
[im 38/44]
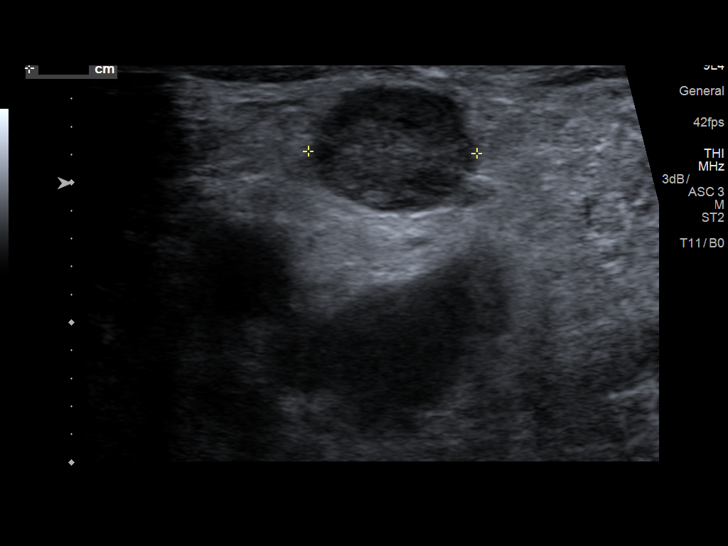
[im 42/44]
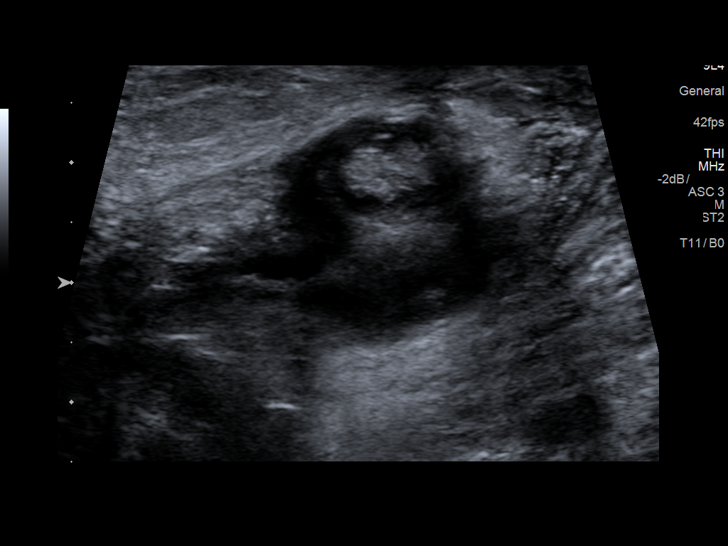
[im 44/44]
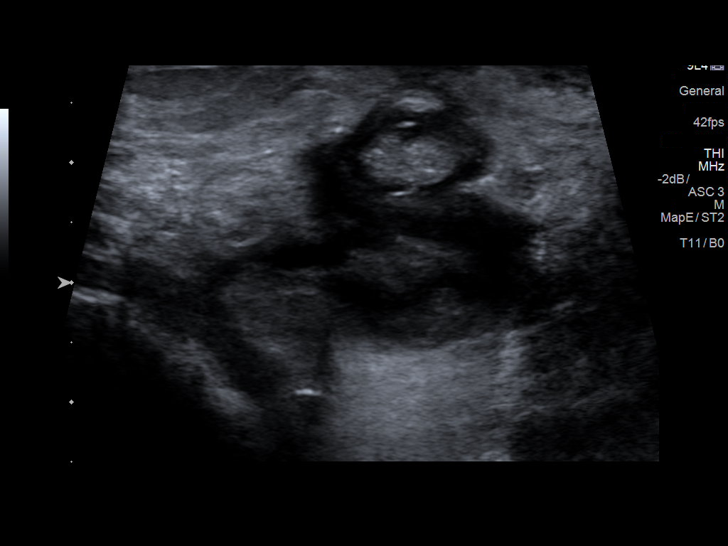

[13 of 24 positions shown; findings below may reference images not displayed]

FINDINGS: Contralateral Common Femoral Vein: Respiratory phasicity is normal
and symmetric with the symptomatic side. No evidence of thrombus.
Normal compressibility.

Common Femoral Vein: No evidence of thrombus. Normal
compressibility, respiratory phasicity and response to augmentation.

Saphenofemoral Junction: No evidence of thrombus. Normal
compressibility and flow on color Doppler imaging.

Profunda Femoral Vein: No evidence of thrombus. Normal
compressibility and flow on color Doppler imaging.

Femoral Vein: No evidence of thrombus. Normal compressibility,
respiratory phasicity and response to augmentation.

Popliteal Vein: No evidence of thrombus. Normal compressibility,
respiratory phasicity and response to augmentation.

Calf Veins: No evidence of thrombus. Normal compressibility and flow
on color Doppler imaging.

Superficial Great Saphenous Vein: No evidence of thrombus. Normal
compressibility.

Venous Reflux:  None.

Other Findings: No evidence of superficial thrombophlebitis. Complex
soft tissue abnormality of the right popliteal fossa measures at
least 5 cm in length and approximately 2 cm in thickness. Nivirus is
likely a Baker's cyst and may be hemorrhagic based on appearance.
IMPRESSION: 1. No evidence of right lower extremity deep venous thrombosis.
2. Complex soft tissue abnormality of the right popliteal fossa
measuring at least 5 cm in greatest length and felt to most likely
represent a complex/hemorrhagic Baker's cyst.

## 2019-12-23 ENCOUNTER — Other Ambulatory Visit: Payer: Self-pay

## 2019-12-23 ENCOUNTER — Emergency Department
Admission: EM | Admit: 2019-12-23 | Discharge: 2019-12-23 | Disposition: A | Discharge status: Home / Self Care | Payer: Medicare HMO | Attending: Emergency Medicine | Admitting: Emergency Medicine

## 2019-12-23 ENCOUNTER — Emergency Department: Payer: Medicare HMO

## 2019-12-23 DIAGNOSIS — I10 Essential (primary) hypertension: Secondary | ICD-10-CM | POA: Diagnosis not present

## 2019-12-23 DIAGNOSIS — R21 Rash and other nonspecific skin eruption: Secondary | ICD-10-CM | POA: Diagnosis present

## 2019-12-23 LAB — CBC WITH DIFFERENTIAL/PLATELET
Abs Immature Granulocytes: 0.02 10*3/uL (ref 0.00–0.07)
Basophils Absolute: 0 10*3/uL (ref 0.0–0.1)
Basophils Relative: 0 %
Eosinophils Absolute: 1 10*3/uL — ABNORMAL HIGH (ref 0.0–0.5)
Eosinophils Relative: 16 %
HCT: 35.6 % — ABNORMAL LOW (ref 36.0–46.0)
Hemoglobin: 11.7 g/dL — ABNORMAL LOW (ref 12.0–15.0)
Immature Granulocytes: 0 %
Lymphocytes Relative: 21 %
Lymphs Abs: 1.3 10*3/uL (ref 0.7–4.0)
MCH: 30.8 pg (ref 26.0–34.0)
MCHC: 32.9 g/dL (ref 30.0–36.0)
MCV: 93.7 fL (ref 80.0–100.0)
Monocytes Absolute: 0.5 10*3/uL (ref 0.1–1.0)
Monocytes Relative: 9 %
Neutro Abs: 3.3 10*3/uL (ref 1.7–7.7)
Neutrophils Relative %: 54 %
Platelets: 224 10*3/uL (ref 150–400)
RBC: 3.8 MIL/uL — ABNORMAL LOW (ref 3.87–5.11)
RDW: 13.5 % (ref 11.5–15.5)
WBC: 6.2 10*3/uL (ref 4.0–10.5)
nRBC: 0 % (ref 0.0–0.2)

## 2019-12-23 LAB — COMPREHENSIVE METABOLIC PANEL
ALT: 21 U/L (ref 0–44)
AST: 24 U/L (ref 15–41)
Albumin: 3.8 g/dL (ref 3.5–5.0)
Alkaline Phosphatase: 48 U/L (ref 38–126)
Anion gap: 7 (ref 5–15)
BUN: 24 mg/dL — ABNORMAL HIGH (ref 8–23)
CO2: 23 mmol/L (ref 22–32)
Calcium: 8.3 mg/dL — ABNORMAL LOW (ref 8.9–10.3)
Chloride: 109 mmol/L (ref 98–111)
Creatinine, Ser: 0.82 mg/dL (ref 0.44–1.00)
GFR calc Af Amer: >60 mL/min (ref >60)
GFR calc non Af Amer: 59 mL/min — ABNORMAL LOW (ref >60)
Glucose, Bld: 108 mg/dL — ABNORMAL HIGH (ref 70–99)
Potassium: 3.5 mmol/L (ref 3.5–5.1)
Sodium: 139 mmol/L (ref 135–145)
Total Bilirubin: 0.4 mg/dL (ref 0.3–1.2)
Total Protein: 6.5 g/dL (ref 6.5–8.1)

## 2019-12-23 LAB — LACTIC ACID, PLASMA: Lactic Acid, Venous: 0.9 mmol/L (ref 0.5–1.9)

## 2019-12-23 MED ORDER — PREDNISONE 10 MG (21) PO TBPK: ORAL_TABLET | ORAL | 0 refills | Status: AC

## 2019-12-23 MED ORDER — DIPHENHYDRAMINE HCL 25 MG PO TABS: 25.0000 mg | ORAL_TABLET | Freq: Four times a day (QID) | ORAL | 0 refills | Status: AC | PRN

## 2019-12-23 MED ORDER — DIPHENHYDRAMINE HCL 25 MG PO CAPS: 25.0000 mg | ORAL_CAPSULE | Freq: Once | ORAL | Status: AC

## 2019-12-23 MED ORDER — PREDNISONE 20 MG PO TABS
40.0000 mg | ORAL_TABLET | Freq: Once | ORAL | Status: AC
  Administered 2019-12-23: 40 mg via ORAL

## 2019-12-23 MED ORDER — PREDNISONE 20 MG PO TABS: 20.0000 mg | ORAL_TABLET | Freq: Once | ORAL | Status: DC

## 2019-12-23 MED ORDER — DIPHENHYDRAMINE HCL 25 MG PO CAPS
ORAL_CAPSULE | ORAL | Status: AC
  Administered 2019-12-23: 25 mg via ORAL
  Filled 2019-12-23: qty 1

## 2019-12-23 NOTE — ED Notes (Signed)
Pt speaks [place], pt refuses translator and requests daughter to be used for translation and communication. Daughter by PT side

## 2019-12-23 NOTE — ED Triage Notes (Signed)
PT reports receiving 2nd dose of COVID vaccination 2 months ago and since she received vaccine pt has been experiencing a rash reaction all over body. PT has redness and swelling noted on legs and arms. Sites on arms are missing top layers of skin. PT states sites itch and are painful.

## 2019-12-23 NOTE — ED Provider Notes (Signed)
[facility] Emergency Department Provider Note   ____________________________________________    I have reviewed the triage vital signs and the nursing notes.   HISTORY  Chief Complaint Rash  Patient's daughter is translating for the patient and her insistence   HPI [name] is a 86 y.o. female with a history of only of hypertension who presents with a rash.  Patient reports she developed a rash approximately 1 to 2 weeks ago, she reports it is itchy and red.  She thinks it may be related to having her Covid vaccination.  Patient reports it started on her arm and spread to her other arm back and on her legs.  She reports it is very itchy.  No fevers, no new medications  Past Medical History:  Diagnosis Date  . Hypertension     Patient Active Problem List   Diagnosis Date Noted  . Cellulitis 05/09/2019    History reviewed. No pertinent surgical history.  Prior to Admission medications   Medication Sig Start Date End Date Taking? Authorizing Provider  amLODipine (NORVASC) 10 MG tablet Take 10 mg by mouth daily. 03/24/19   [provider]  azelastine (OPTIVAR) 0.05 % ophthalmic solution Place 1 drop into both eyes 2 (two) times a day. 03/26/19   [provider]  diphenhydrAMINE (BENADRYL ALLERGY) 25 MG tablet Take 1 tablet (25 mg total) by mouth every 6 (six) hours as needed. 12/23/19   Lavonia Drafts, MD  furosemide (LASIX) 20 MG tablet Take 1 tablet (20 mg total) by mouth daily as needed for fluid or edema. 05/11/19 05/10/20  Gladstone Lighter, MD  hydrOXYzine (ATARAX/VISTARIL) 10 MG tablet Take 10 mg by mouth 4 (four) times daily as needed for itching. 03/27/19   [provider]  lidocaine (LIDODERM) 5 % Place 1 patch onto the skin every 12 (twelve) hours. Remove & Discard patch within 12 hours or as directed by MD 04/04/19 04/03/20  Merlyn Lot, MD  potassium chloride SA (K-DUR) 20 MEQ tablet Take 1 tablet (20 mEq  total) by mouth daily as needed (only on the days when taking lasix). 05/11/19   Gladstone Lighter, MD  predniSONE (STERAPRED UNI-PAK 21 TAB) 10 MG (21) TBPK tablet Take 4 tablets (40 mg total) by mouth daily for 4 days, THEN 3 tablets (30 mg total) daily for 3 days, THEN 2 tablets (20 mg total) daily for 3 days, THEN 1 tablet (10 mg total) daily for 3 days. 12/23/19 01/05/20  Lavonia Drafts, MD  triamcinolone cream (KENALOG) 0.1 % Apply 1 application topically 2 (two) times a day. 03/23/19   [provider]  VENTOLIN HFA 108 (90 Base) MCG/ACT inhaler Inhale 2 puffs into the lungs every 4 (four) hours as needed for wheezing. 03/23/19   [provider]     Allergies Patient has no known allergies.  History reviewed. No pertinent family history.  Social History Social History   Tobacco Use  . Smoking status: Never Smoker  . Smokeless tobacco: Never Used  Substance Use Topics  . Alcohol use: Never  . Drug use: Never    Review of Systems   .    Musculoskeletal: No joint swelling Skin: As above Neurological: Negative for headaches    ____________________________________________   PHYSICAL EXAM:  VITAL SIGNS: ED Triage Vitals  Enc Vitals Group     BP 12/23/19 2041 (!) 177/58     Pulse Rate 12/23/19 2041 73     Resp 12/23/19 2041 18  Temp 12/23/19 2041 97.7 F (36.5 C)     Temp Source 12/23/19 2041 Oral     SpO2 12/23/19 2041 99 %     Weight --      Height 12/23/19 2042 1.499 m (4\' 11" )     Head Circumference --      Peak Flow --      Pain Score 12/23/19 2041 10     Pain Loc --      Pain Edu? --      Excl. in GC? --     Constitutional: Alert and oriented.   Nose: No congestion/rhinnorhea. Mouth/Throat: Mucous membranes are moist.    Cardiovascular: Normal rate, regular rhythm.  Good peripheral circulation. Respiratory: Normal respiratory effort.  No retractions. Gastrointestinal: Soft and nontender. No distention.    Musculoskeletal:  Warm  and well perfused Neurologic:  Normal speech and language. No gross focal neurologic deficits are appreciated.  Skin:  Skin is warm, dry, patchy areas of erythema with excoriations on the back, arms.  Nonblanching, no vesicles or pustules, not consistent with scabies Psychiatric: Mood and affect are normal. Speech and behavior are normal.  ____________________________________________   LABS (all labs ordered are listed, but only abnormal results are displayed)  Labs Reviewed  COMPREHENSIVE METABOLIC PANEL - Abnormal; Notable for the following components:      Result Value   Glucose, Bld 108 (*)    BUN 24 (*)    Calcium 8.3 (*)    GFR calc non Af Amer 59 (*)    All other components within normal limits  CBC WITH DIFFERENTIAL/PLATELET - Abnormal; Notable for the following components:   RBC 3.80 (*)    Hemoglobin 11.7 (*)    HCT 35.6 (*)    Eosinophils Absolute 1.0 (*)    All other components within normal limits  LACTIC ACID, PLASMA  LACTIC ACID, PLASMA   ____________________________________________  EKG   ____________________________________________  RADIOLOGY   ____________________________________________   PROCEDURES  Procedure(s) performed: No  Procedures   Critical Care performed: No ____________________________________________   INITIAL IMPRESSION / ASSESSMENT AND PLAN / ED COURSE  Pertinent labs & imaging results that were available during my care of the patient were reviewed by me and considered in my medical decision making (see chart for details).  Patient well-appearing, afebrile, lab work is reassuring, nonspecific rash, not consistent with scabies.  Considered disseminated shingles although does not have the appearance of herpes.  We will start the patient on prednisone taper, Benadryl, referral to dermatology    ____________________________________________   FINAL CLINICAL IMPRESSION(S) / ED DIAGNOSES  Final diagnoses:  Rash         Note:  This document was prepared using Dragon voice recognition software and may include unintentional dictation errors.   2042, MD 12/23/19 [phone]

## 2021-03-01 ENCOUNTER — Encounter: Payer: Self-pay | Admitting: Emergency Medicine

## 2021-03-01 ENCOUNTER — Other Ambulatory Visit: Payer: Self-pay

## 2021-03-01 ENCOUNTER — Ambulatory Visit
Admission: EM | Admit: 2021-03-01 | Discharge: 2021-03-01 | Disposition: A | Discharge status: Other Acute Inpatient Hospital | Payer: Medicare HMO | Attending: Family Medicine | Admitting: Family Medicine

## 2021-03-01 DIAGNOSIS — L03119 Cellulitis of unspecified part of limb: Secondary | ICD-10-CM

## 2021-03-01 DIAGNOSIS — Z20822 Contact with and (suspected) exposure to covid-19: Secondary | ICD-10-CM | POA: Diagnosis present

## 2021-03-01 DIAGNOSIS — R600 Localized edema: Secondary | ICD-10-CM

## 2021-03-01 DIAGNOSIS — Z66 Do not resuscitate: Secondary | ICD-10-CM | POA: Diagnosis present

## 2021-03-01 DIAGNOSIS — J45909 Unspecified asthma, uncomplicated: Secondary | ICD-10-CM | POA: Diagnosis present

## 2021-03-01 DIAGNOSIS — L03115 Cellulitis of right lower limb: Secondary | ICD-10-CM | POA: Diagnosis not present

## 2021-03-01 DIAGNOSIS — I10 Essential (primary) hypertension: Secondary | ICD-10-CM | POA: Diagnosis present

## 2021-03-01 DIAGNOSIS — L0390 Cellulitis, unspecified: Secondary | ICD-10-CM | POA: Diagnosis not present

## 2021-03-01 DIAGNOSIS — Z79899 Other long term (current) drug therapy: Secondary | ICD-10-CM

## 2021-03-01 DIAGNOSIS — L03116 Cellulitis of left lower limb: Secondary | ICD-10-CM | POA: Diagnosis present

## 2021-03-01 DIAGNOSIS — D649 Anemia, unspecified: Secondary | ICD-10-CM | POA: Diagnosis present

## 2021-03-01 LAB — BASIC METABOLIC PANEL
Anion gap: 7 (ref 5–15)
BUN: 18 mg/dL (ref 8–23)
CO2: 25 mmol/L (ref 22–32)
Calcium: 8.5 mg/dL — ABNORMAL LOW (ref 8.9–10.3)
Chloride: 108 mmol/L (ref 98–111)
Creatinine, Ser: 0.85 mg/dL (ref 0.44–1.00)
GFR, Estimated: >60 mL/min (ref >60)
Glucose, Bld: 100 mg/dL — ABNORMAL HIGH (ref 70–99)
Potassium: 4.4 mmol/L (ref 3.5–5.1)
Sodium: 140 mmol/L (ref 135–145)

## 2021-03-01 LAB — BRAIN NATRIURETIC PEPTIDE: B Natriuretic Peptide: 138.4 pg/mL — ABNORMAL HIGH (ref 0.0–100.0)

## 2021-03-01 LAB — CBC
HCT: 34.6 % — ABNORMAL LOW (ref 36.0–46.0)
Hemoglobin: 11.1 g/dL — ABNORMAL LOW (ref 12.0–15.0)
MCH: 31.2 pg (ref 26.0–34.0)
MCHC: 32.1 g/dL (ref 30.0–36.0)
MCV: 97.2 fL (ref 80.0–100.0)
Platelets: 287 10*3/uL (ref 150–400)
RBC: 3.56 MIL/uL — ABNORMAL LOW (ref 3.87–5.11)
RDW: 13.1 % (ref 11.5–15.5)
WBC: 5.2 10*3/uL (ref 4.0–10.5)
nRBC: 0 % (ref 0.0–0.2)

## 2021-03-01 NOTE — ED Triage Notes (Signed)
Pt arrived via POV with daughter reports bilateral lower extremity swelling x 1 week. Was seen at urgent care earlier this morning and was referred to ED.  Pt ambulatory with cane.   Pt c/o redness and pain when walking.    On assessment, pt has 3+ pitting edema bilaterally with the L worse than the R, clear fluid also leaking from both legs.  Daughter denies any recent long travel or prolong periods of sitting.  No previous hx of blood clots.

## 2021-03-01 NOTE — ED Provider Notes (Signed)
[facility]    CSN: [id number] Arrival date & time: 03/01/21  0846      History   Chief Complaint Chief Complaint  Patient presents with  . Leg Pain    bilateral   HPI  87 year old female presents with the above complaints.  Daughter states that this started approximate 1 week ago.  She has redness, warmth, and pain of both of her lower extremities.  She states that her legs have been weeping as well.  There are breaks in the skin.  She has a rash of her upper extremities at the flexor crease as well.  No relieving factors.  No documented fever.  No other complaints.  Past Medical History:  Diagnosis Date  . Hypertension     Patient Active Problem List   Diagnosis Date Noted  . Cellulitis 05/09/2019    History reviewed. No pertinent surgical history.  OB History   No obstetric history on file.      Home Medications    Prior to Admission medications   Medication Sig Start Date End Date Taking? Authorizing Provider  amLODipine (NORVASC) 10 MG tablet Take 10 mg by mouth daily. 03/24/19  Yes [provider]  FEROSUL 325 (65 Fe) MG tablet Take 325 mg by mouth 2 (two) times daily. 11/19/20  Yes [provider]  hydrOXYzine (ATARAX/VISTARIL) 10 MG tablet Take 10 mg by mouth 4 (four) times daily as needed for itching. 03/27/19  Yes [provider]  VENTOLIN HFA 108 (90 Base) MCG/ACT inhaler Inhale 2 puffs into the lungs every 4 (four) hours as needed for wheezing. 03/23/19  Yes [provider]  azelastine (OPTIVAR) 0.05 % ophthalmic solution Place 1 drop into both eyes 2 (two) times a day. 03/26/19   [provider]  diphenhydrAMINE (BENADRYL ALLERGY) 25 MG tablet Take 1 tablet (25 mg total) by mouth every 6 (six) hours as needed. 12/23/19   Jene Every, MD  doxepin (SINEQUAN) 25 MG capsule Take 25 mg by mouth at bedtime. 02/24/21   [provider]  furosemide (LASIX) 20 MG tablet Take 1 tablet (20 mg total) by  mouth daily as needed for fluid or edema. 05/11/19 05/10/20  Enid Baas, MD  triamcinolone cream (KENALOG) 0.1 % Apply 1 application topically 2 (two) times a day. 03/23/19   [provider]  potassium chloride SA (K-DUR) 20 MEQ tablet Take 1 tablet (20 mEq total) by mouth daily as needed (only on the days when taking lasix). 05/11/19 03/01/21  Enid Baas, MD    Family History History reviewed. No pertinent family history.  Social History Social History   Tobacco Use  . Smoking status: Never Smoker  . Smokeless tobacco: Never Used  Vaping Use  . Vaping Use: Never used  Substance Use Topics  . Alcohol use: Never  . Drug use: Never     Allergies   Patient has no known allergies.   Review of Systems Review of Systems Per HPI  Physical Exam Triage Vital Signs ED Triage Vitals  Enc Vitals Group     BP 03/01/21 0931 (!) 161/74     Pulse Rate 03/01/21 0931 62     Resp 03/01/21 0931 15     Temp 03/01/21 0931 97.6 F (36.4 C)     Temp Source 03/01/21 0931 Oral     SpO2 03/01/21 0931 100 %     Weight 03/01/21 0927 110 lb 0.2 oz (49.9 kg)     Height 03/01/21 0927 4\' 11"  (  1.499 m)     Head Circumference --      Peak Flow --      Pain Score 03/01/21 0927 6     Pain Loc --      Pain Edu? --      Excl. in GC? --    No data found.  Updated Vital Signs BP (!) 161/74 (BP Location: Right Arm)   Pulse 62   Temp 97.6 F (36.4 C) (Oral)   Resp 15   Ht 4\' 11"  (1.499 m)   Wt 49.9 kg   SpO2 100%   BMI 22.22 kg/m   Visual Acuity Right Eye Distance:   Left Eye Distance:   Bilateral Distance:    Right Eye Near:   Left Eye Near:    Bilateral Near:     Physical Exam Vitals and nursing note reviewed.  Constitutional:      General: She is not in acute distress.    Appearance: She is not ill-appearing.  HENT:     Head: Normocephalic and atraumatic.  Eyes:     General:        Right eye: No discharge.        Left eye: No discharge.      Conjunctiva/sclera: Conjunctivae normal.  Pulmonary:     Effort: Pulmonary effort is normal. No respiratory distress.  Skin:    Comments: 3+ pitting edema of the lower extremities bilaterally.  Extends to the knee.  There is warmth and diffuse erythema as well.  Breaks in the skin noted.  Neurological:     Mental Status: She is alert.  Psychiatric:        Mood and Affect: Mood normal.        Behavior: Behavior normal.     UC Treatments / Results  Labs (all labs ordered are listed, but only abnormal results are displayed) Labs Reviewed - No data to display  EKG   Radiology No results found.  Procedures Procedures (including critical care time)  Medications Ordered in UC Medications - No data to display  Initial Impression / Assessment and Plan / UC Course  I have reviewed the triage vital signs and the nursing notes.  Pertinent labs & imaging results that were available during my care of the patient were reviewed by me and considered in my medical decision making (see chart for details).    87 year old female presents with severe edema, erythema, and warmth of the lower extremities bilaterally.  Patient needs laboratory studies, imaging, and will likely need IV antibiotics given her exam findings.  I have advised the daughter to take her to the ER for further evaluation and management.  Final Clinical Impressions(s) / UC Diagnoses   Final diagnoses:  Lower extremity edema  Cellulitis of lower extremity, unspecified laterality     Discharge Instructions     Given her exam findings, she needs further evaluation and treatment in the ER.  [facility] [address] [place], [place]  Dr. [id number]    ED Prescriptions    None     PDMP not reviewed this encounter.   Adriana Simas, Tommie Sams 03/01/21 [phone]

## 2021-03-01 NOTE — ED Notes (Signed)
Patient is being discharged from the Urgent Care and sent to the Emergency Department via POV with daughter . Per Dr Adriana Simas, patient is in need of higher level of care due to current symptoms. Patient/daughter is aware and verbalizes understanding of plan of care.  Vitals:   03/01/21 0931  BP: (!) 161/74  Pulse: 62  Resp: 15  Temp: 97.6 F (36.4 C)  SpO2: 100%

## 2021-03-01 NOTE — ED Triage Notes (Signed)
Patient's daughter states that both her legs started to swell a week ago.  Patient c/o redness and pain in both legs.

## 2021-03-01 NOTE — Discharge Instructions (Signed)
Given her exam findings, she needs further evaluation and treatment in the ER.  [facility] [address], [place], [place] [address]  Dr. Adriana Simas

## 2021-03-01 NOTE — ED Notes (Signed)
D/w Dr. Derrill Kay, no need for [place] at this time.

## 2021-03-02 ENCOUNTER — Inpatient Hospital Stay
Admission: EM | Admit: 2021-03-02 | Discharge: 2021-03-03 | DRG: 603 | Disposition: A | Discharge status: Home / Self Care | Payer: Medicare HMO | Attending: Internal Medicine | Admitting: Internal Medicine

## 2021-03-02 ENCOUNTER — Encounter: Payer: Self-pay | Admitting: Internal Medicine

## 2021-03-02 ENCOUNTER — Observation Stay: Payer: Medicare HMO

## 2021-03-02 DIAGNOSIS — Z66 Do not resuscitate: Secondary | ICD-10-CM | POA: Diagnosis present

## 2021-03-02 DIAGNOSIS — L0390 Cellulitis, unspecified: Secondary | ICD-10-CM | POA: Diagnosis present

## 2021-03-02 DIAGNOSIS — L03116 Cellulitis of left lower limb: Secondary | ICD-10-CM | POA: Diagnosis present

## 2021-03-02 DIAGNOSIS — I10 Essential (primary) hypertension: Secondary | ICD-10-CM | POA: Diagnosis present

## 2021-03-02 DIAGNOSIS — D649 Anemia, unspecified: Secondary | ICD-10-CM | POA: Diagnosis present

## 2021-03-02 DIAGNOSIS — Z79899 Other long term (current) drug therapy: Secondary | ICD-10-CM | POA: Diagnosis not present

## 2021-03-02 DIAGNOSIS — Z20822 Contact with and (suspected) exposure to covid-19: Secondary | ICD-10-CM | POA: Diagnosis present

## 2021-03-02 DIAGNOSIS — L03119 Cellulitis of unspecified part of limb: Secondary | ICD-10-CM

## 2021-03-02 DIAGNOSIS — J45909 Unspecified asthma, uncomplicated: Secondary | ICD-10-CM | POA: Diagnosis present

## 2021-03-02 DIAGNOSIS — L03115 Cellulitis of right lower limb: Secondary | ICD-10-CM | POA: Diagnosis present

## 2021-03-02 LAB — RESP PANEL BY RT-PCR (FLU A&B, COVID) ARPGX2
Influenza A by PCR: NEGATIVE
Influenza B by PCR: NEGATIVE
SARS Coronavirus 2 by RT PCR: NEGATIVE

## 2021-03-02 LAB — CBC
HCT: 33.8 % — ABNORMAL LOW (ref 36.0–46.0)
Hemoglobin: 11.3 g/dL — ABNORMAL LOW (ref 12.0–15.0)
MCH: 32.1 pg (ref 26.0–34.0)
MCHC: 33.4 g/dL (ref 30.0–36.0)
MCV: 96 fL (ref 80.0–100.0)
Platelets: 276 10*3/uL (ref 150–400)
RBC: 3.52 MIL/uL — ABNORMAL LOW (ref 3.87–5.11)
RDW: 13 % (ref 11.5–15.5)
WBC: 9.1 10*3/uL (ref 4.0–10.5)
nRBC: 0 % (ref 0.0–0.2)

## 2021-03-02 LAB — BASIC METABOLIC PANEL
Anion gap: 8 (ref 5–15)
BUN: 12 mg/dL (ref 8–23)
CO2: 20 mmol/L — ABNORMAL LOW (ref 22–32)
Calcium: 7.9 mg/dL — ABNORMAL LOW (ref 8.9–10.3)
Chloride: 111 mmol/L (ref 98–111)
Creatinine, Ser: 0.68 mg/dL (ref 0.44–1.00)
GFR, Estimated: >60 mL/min (ref >60)
Glucose, Bld: 84 mg/dL (ref 70–99)
Potassium: 3.9 mmol/L (ref 3.5–5.1)
Sodium: 139 mmol/L (ref 135–145)

## 2021-03-02 MED ORDER — AMLODIPINE BESYLATE 10 MG PO TABS
10.0000 mg | ORAL_TABLET | Freq: Every day | ORAL | Status: DC
  Administered 2021-03-02 – 2021-03-03 (×2): 10 mg via ORAL
  Filled 2021-03-02 (×2): qty 1

## 2021-03-02 MED ORDER — ONDANSETRON HCL 4 MG/2ML IJ SOLN
4.0000 mg | Freq: Once | INTRAMUSCULAR | Status: DC
  Administered 2021-03-02: 4 mg via INTRAVENOUS
  Filled 2021-03-02: qty 2

## 2021-03-02 MED ORDER — FERROUS SULFATE 325 (65 FE) MG PO TABS
325.0000 mg | ORAL_TABLET | Freq: Two times a day (BID) | ORAL | Status: DC
  Administered 2021-03-02 – 2021-03-03 (×3): 325 mg via ORAL
  Filled 2021-03-02 (×3): qty 1

## 2021-03-02 MED ORDER — SODIUM CHLORIDE 0.9 % IV SOLN
2.0000 g | Freq: Once | INTRAVENOUS | Status: DC
  Filled 2021-03-02: qty 20

## 2021-03-02 MED ORDER — ENOXAPARIN SODIUM 30 MG/0.3ML IJ SOSY
30.0000 mg | PREFILLED_SYRINGE | INTRAMUSCULAR | Status: DC
  Administered 2021-03-02 – 2021-03-03 (×2): 30 mg via SUBCUTANEOUS
  Filled 2021-03-02 (×2): qty 0.3

## 2021-03-02 MED ORDER — ALBUTEROL SULFATE HFA 108 (90 BASE) MCG/ACT IN AERS: 2.0000 | INHALATION_SPRAY | RESPIRATORY_TRACT | Status: DC | PRN

## 2021-03-02 MED ORDER — VANCOMYCIN HCL 750 MG/150ML IV SOLN
750.0000 mg | INTRAVENOUS | Status: DC
  Filled 2021-03-02: qty 150

## 2021-03-02 MED ORDER — FENTANYL CITRATE (PF) 100 MCG/2ML IJ SOLN
50.0000 ug | Freq: Once | INTRAMUSCULAR | Status: DC
  Administered 2021-03-02: 50 ug via INTRAVENOUS
  Filled 2021-03-02: qty 2

## 2021-03-02 MED ORDER — VANCOMYCIN HCL IN DEXTROSE 1-5 GM/200ML-% IV SOLN: 1000.0000 mg | Freq: Once | INTRAVENOUS | Status: DC

## 2021-03-02 MED ORDER — DOXEPIN HCL 25 MG PO CAPS
25.0000 mg | ORAL_CAPSULE | Freq: Every day | ORAL | Status: DC
  Administered 2021-03-02: 25 mg via ORAL
  Filled 2021-03-02 (×3): qty 1

## 2021-03-02 MED ORDER — SODIUM CHLORIDE 0.9 % IV SOLN
2.0000 g | INTRAVENOUS | Status: DC
  Administered 2021-03-02 – 2021-03-03 (×2): 2 g via INTRAVENOUS
  Filled 2021-03-02 (×3): qty 2

## 2021-03-02 MED ORDER — ACETAMINOPHEN 325 MG PO TABS
650.0000 mg | ORAL_TABLET | Freq: Four times a day (QID) | ORAL | Status: DC | PRN
  Administered 2021-03-02: 650 mg via ORAL
  Filled 2021-03-02: qty 2

## 2021-03-02 MED ORDER — ACETAMINOPHEN 650 MG RE SUPP: 650.0000 mg | Freq: Four times a day (QID) | RECTAL | Status: DC | PRN

## 2021-03-02 MED ORDER — KETOTIFEN FUMARATE 0.025 % OP SOLN
1.0000 [drp] | Freq: Two times a day (BID) | OPHTHALMIC | Status: DC
  Administered 2021-03-02 – 2021-03-03 (×3): 1 [drp] via OPHTHALMIC
  Filled 2021-03-02 (×2): qty 5

## 2021-03-02 MED ORDER — ALBUTEROL SULFATE (2.5 MG/3ML) 0.083% IN NEBU: 2.5000 mg | INHALATION_SOLUTION | RESPIRATORY_TRACT | Status: DC | PRN

## 2021-03-02 MED ORDER — HYDROXYZINE HCL 10 MG PO TABS
10.0000 mg | ORAL_TABLET | Freq: Four times a day (QID) | ORAL | Status: DC | PRN
  Filled 2021-03-02: qty 1

## 2021-03-02 MED ORDER — VANCOMYCIN HCL IN DEXTROSE 1-5 GM/200ML-% IV SOLN
1000.0000 mg | Freq: Once | INTRAVENOUS | Status: AC
  Administered 2021-03-02: 1000 mg via INTRAVENOUS
  Filled 2021-03-02: qty 200

## 2021-03-02 MED ORDER — HYDRALAZINE HCL 20 MG/ML IJ SOLN
5.0000 mg | INTRAMUSCULAR | Status: DC | PRN
  Administered 2021-03-02: 5 mg via INTRAVENOUS
  Filled 2021-03-02: qty 1

## 2021-03-02 MED ORDER — TRIAMCINOLONE ACETONIDE 0.1 % EX CREA
TOPICAL_CREAM | Freq: Two times a day (BID) | CUTANEOUS | Status: DC
  Filled 2021-03-02: qty 15

## 2021-03-02 MED ORDER — BETAMETHASONE VALERATE 0.12 % EX FOAM: Freq: Two times a day (BID) | CUTANEOUS | Status: DC

## 2021-03-02 NOTE — Progress Notes (Signed)
PROGRESS NOTE    [name]  PXT:[id number] DOB: 1918-06-11 DOA: 03/02/2021 PCP: Rayetta Humphrey, MD   Assessment & Plan:   Principal Problem:   Cellulitis of both lower extremities Active Problems:   Essential hypertension   Normocytic anemia   Cellulitis, leg  Possible b/l LE cellulitis: still w/ pain, erythema & warmth to palpation. Continue on IV cefepime, vanco. [place] of b/l LE was neg for DVTs  HTN: continue on amlodipine. IV hydralazine prn  Possible IDA: will check iron levels. Continue on iron supplement for now. No need for a transfusion currently   Hx of asthma: unknown stage &/or severity. W/o exacerbation. Continue on bronchodilators prn    DVT prophylaxis: lovenox  Code Status: DNR Family Communication: Discussed pt's care w/ pt's son, Gillis Ends, and answered his questions  Disposition Plan: likely d/c back home   Level of care: Med-Surg    Status is: Observation  The patient remains OBS appropriate and will d/c before 2 midnights.  Dispo: The patient is from: Home              Anticipated d/c is to: Home              Patient currently is not medically stable to d/c.   Difficult to place patient unclear     Consultants:      Procedures:    Antimicrobials: cefepime, vanco    Subjective: Pt c/o left knee pain  Objective: Vitals:   03/02/21 0230 03/02/21 0300 03/02/21 0341 03/02/21 0748  BP: (!) 169/69 (!) 148/71 (!) 176/95 (!) 134/52  Pulse: 71 72 71 68  Resp: 17 19 17 16   Temp: 98 F (36.7 C)  (!) 97.3 F (36.3 C) 99.3 F (37.4 C)  TempSrc:   Oral   SpO2: 100% 100% 99%   Weight:      Height:        Intake/Output Summary (Last 24 hours) at 03/02/2021 0804 Last data filed at 03/02/2021 0400 Gross per 24 hour  Intake 600 ml  Output --  Net 600 ml   Filed Weights   03/01/21 1948  Weight: 49.9 kg    Examination:  General exam: Appears calm and comfortable  Respiratory system: Clear to auscultation. Respiratory effort  normal. Cardiovascular system: S1 & S2+. No rubs, gallops or clicks. Gastrointestinal system: Abdomen is nondistended, soft and nontender. Normal bowel sounds heard. Central nervous system: Alert and oriented. Moves all 4 extremities  Skin: b/l LE are erythematous, warmth to palpation Psychiatry: Judgement and insight appear normal. Mood & affect appropriate.     Data Reviewed: I have personally reviewed following labs and imaging studies  CBC: Recent Labs  Lab 03/01/21 2005 03/02/21 0534  WBC 5.2 9.1  HGB 11.1* 11.3*  HCT 34.6* 33.8*  MCV 97.2 96.0  PLT 287 276   Basic Metabolic Panel: Recent Labs  Lab 03/01/21 2005 03/02/21 0534  NA 140 139  K 4.4 3.9  CL 108 111  CO2 25 20*  GLUCOSE 100* 84  BUN 18 12  CREATININE 0.85 0.68  CALCIUM 8.5* 7.9*   GFR: Estimated Creatinine Clearance: 24.9 mL/min (by C-G formula based on SCr of 0.68 mg/dL). Liver Function Tests: No results for input(s): AST, ALT, ALKPHOS, BILITOT, PROT, ALBUMIN in the last 168 hours. No results for input(s): LIPASE, AMYLASE in the last 168 hours. No results for input(s): AMMONIA in the last 168 hours. Coagulation Profile: No results for input(s): INR, PROTIME in the last 168 hours.  Cardiac Enzymes: No results for input(s): CKTOTAL, CKMB, CKMBINDEX, TROPONINI in the last 168 hours. BNP (last 3 results) No results for input(s): PROBNP in the last 8760 hours. HbA1C: No results for input(s): HGBA1C in the last 72 hours. CBG: No results for input(s): GLUCAP in the last 168 hours. Lipid Profile: No results for input(s): CHOL, HDL, LDLCALC, TRIG, CHOLHDL, LDLDIRECT in the last 72 hours. Thyroid Function Tests: No results for input(s): TSH, T4TOTAL, FREET4, T3FREE, THYROIDAB in the last 72 hours. Anemia Panel: No results for input(s): VITAMINB12, FOLATE, FERRITIN, TIBC, IRON, RETICCTPCT in the last 72 hours. Sepsis Labs: No results for input(s): PROCALCITON, LATICACIDVEN in the last 168  hours.  Recent Results (from the past 240 hour(s))  Resp Panel by RT-PCR (Flu A&B, Covid) Nasopharyngeal Swab     Status: None   Collection Time: 03/02/21  1:27 AM   Specimen: Nasopharyngeal Swab; Nasopharyngeal(NP) swabs in vial transport medium  Result Value Ref Range Status   SARS Coronavirus 2 by RT PCR NEGATIVE NEGATIVE Final    Comment: (NOTE) SARS-CoV-2 target nucleic acids are NOT DETECTED.  The SARS-CoV-2 RNA is generally detectable in upper respiratory specimens during the acute phase of infection. The lowest concentration of SARS-CoV-2 viral copies this assay can detect is 138 copies/mL. A negative result does not preclude SARS-Cov-2 infection and should not be used as the sole basis for treatment or other patient management decisions. A negative result may occur with  improper specimen collection/handling, submission of specimen other than nasopharyngeal swab, presence of viral mutation(s) within the areas targeted by this assay, and inadequate number of viral copies(<138 copies/mL). A negative result must be combined with clinical observations, patient history, and epidemiological information. The expected result is Negative.  Fact Sheet for Patients:  BloggerCourse.com  Fact Sheet for Healthcare Providers:  SeriousBroker.it  This test is no t yet approved or cleared by the [place] FDA and  has been authorized for detection and/or diagnosis of SARS-CoV-2 by FDA under an Emergency Use Authorization (EUA). This EUA will remain  in effect (meaning this test can be used) for the duration of the COVID-19 declaration under Section 564(b)(1) of the Act, 21 U.S.C.section 360bbb-3(b)(1), unless the authorization is terminated  or revoked sooner.       Influenza A by PCR NEGATIVE NEGATIVE Final   Influenza B by PCR NEGATIVE NEGATIVE Final    Comment: (NOTE) The Xpert Xpress SARS-CoV-2/FLU/RSV plus assay is intended  as an aid in the diagnosis of influenza from Nasopharyngeal swab specimens and should not be used as a sole basis for treatment. Nasal washings and aspirates are unacceptable for Xpert Xpress SARS-CoV-2/FLU/RSV testing.  Fact Sheet for Patients: BloggerCourse.com  Fact Sheet for Healthcare Providers: SeriousBroker.it  This test is not yet approved or cleared by the [place] FDA and has been authorized for detection and/or diagnosis of SARS-CoV-2 by FDA under an Emergency Use Authorization (EUA). This EUA will remain in effect (meaning this test can be used) for the duration of the COVID-19 declaration under Section 564(b)(1) of the Act, 21 U.S.C. section 360bbb-3(b)(1), unless the authorization is terminated or revoked.  Performed at [facility], [address] [place], [place] [address]          Radiology Studies: US Venous Img Lower Bilateral  Result Date: 03/02/2021 CLINICAL DATA:  Leg swelling and erythema EXAM: BILATERAL LOWER EXTREMITY VENOUS DOPPLER ULTRASOUND TECHNIQUE: Gray-scale sonography with compression, as well as color and duplex ultrasound, were performed to evaluate the deep  venous system(s) from the level of the common femoral vein through the popliteal and proximal calf veins. COMPARISON:  None. FINDINGS: VENOUS Normal compressibility of the common femoral, superficial femoral, and popliteal veins, as well as the visualized calf veins. Visualized portions of profunda femoral vein and great saphenous vein unremarkable. No filling defects to suggest DVT on grayscale or color Doppler imaging. Doppler waveforms show normal direction of venous flow, normal respiratory plasticity and response to augmentation. OTHER None. Limitations: none IMPRESSION: Negative. Electronically Signed   By: Helyn Numbers MD   On: 03/02/2021 02:46        Scheduled Meds: . amLODipine  10 mg Oral Daily  . Betamethasone  Valerate   Topical BID  . doxepin  25 mg Oral QHS  . enoxaparin (LOVENOX) injection  30 mg Subcutaneous Q24H  . ferrous sulfate  325 mg Oral BID  . ketotifen  1 drop Both Eyes BID   Continuous Infusions: . ceFEPime (MAXIPIME) IV Stopped (03/02/21 0245)  . [START ON 03/04/2021] vancomycin       LOS: 0 days    Time spent: 30 mins     Charise Killian, MD [facility] Pager [phone]  If 7PM-7AM, please contact night-coverage 03/02/2021, 8:04 AM

## 2021-03-02 NOTE — Plan of Care (Signed)
  Problem: Education: Goal: Knowledge of General Education information will improve Description: Including pain rating scale, medication(s)/side effects and non-pharmacologic comfort measures 03/02/2021 0450 by Jeral Pinch, RN Outcome: Progressing 03/02/2021 0449 by Jeral Pinch, RN Outcome: Progressing   Problem: Health Behavior/Discharge Planning: Goal: Ability to manage health-related needs will improve 03/02/2021 0450 by Jeral Pinch, RN Outcome: Progressing 03/02/2021 0449 by Jeral Pinch, RN Outcome: Progressing   Problem: Clinical Measurements: Goal: Ability to maintain clinical measurements within normal limits will improve 03/02/2021 0450 by Jeral Pinch, RN Outcome: Progressing 03/02/2021 0449 by Jeral Pinch, RN Outcome: Progressing   Problem: Activity: Goal: Risk for activity intolerance will decrease 03/02/2021 0450 by Jeral Pinch, RN Outcome: Progressing 03/02/2021 0449 by Jeral Pinch, RN Outcome: Progressing   Problem: Nutrition: Goal: Adequate nutrition will be maintained Outcome: Progressing   Problem: Coping: Goal: Level of anxiety will decrease Outcome: Progressing   Problem: Elimination: Goal: Will not experience complications related to bowel motility Outcome: Progressing

## 2021-03-02 NOTE — Progress Notes (Signed)
Pt orders revived noted to be on cardiac monitoring, pt already on floor, no  tele available on floor charge nurse aware. Observation continues. BP elevated Prn drugs given

## 2021-03-02 NOTE — ED Provider Notes (Signed)
[facility] Emergency Department Provider Note  ____________________________________________   Event Date/Time   First MD Initiated Contact with Patient 03/02/21 0024     (approximate)  I have reviewed the triage vital signs and the nursing notes.   HISTORY  Chief Complaint Leg Swelling    HPI [name] is a 87 y.o. female with history of hypertension who presents to the emergency department with her daughter for 1 week of increasing bilateral lower extremity swelling, pain, redness, weeping.  No known fevers, chills, chest pain or shortness of breath.  Patient has had cellulitis before that improved with Keflex as an outpatient.  Daughter at bedside providing most of the history.  Patient speaks [place].  Seen at urgent care today and sent to the emergency department for further evaluation.        Past Medical History:  Diagnosis Date  . Hypertension     Patient Active Problem List   Diagnosis Date Noted  . Cellulitis 05/09/2019    History reviewed. No pertinent surgical history.  Prior to Admission medications   Medication Sig Start Date End Date Taking? Authorizing Provider  amLODipine (NORVASC) 10 MG tablet Take 10 mg by mouth daily. 03/24/19   [provider]  azelastine (OPTIVAR) 0.05 % ophthalmic solution Place 1 drop into both eyes 2 (two) times a day. 03/26/19   [provider]  diphenhydrAMINE (BENADRYL ALLERGY) 25 MG tablet Take 1 tablet (25 mg total) by mouth every 6 (six) hours as needed. 12/23/19   Jene Every, MD  doxepin (SINEQUAN) 25 MG capsule Take 25 mg by mouth at bedtime. 02/24/21   [provider]  FEROSUL 325 (65 Fe) MG tablet Take 325 mg by mouth 2 (two) times daily. 11/19/20   [provider]  furosemide (LASIX) 20 MG tablet Take 1 tablet (20 mg total) by mouth daily as needed for fluid or edema. 05/11/19 05/10/20  Enid Baas, MD  hydrOXYzine (ATARAX/VISTARIL) 10 MG tablet  Take 10 mg by mouth 4 (four) times daily as needed for itching. 03/27/19   [provider]  triamcinolone cream (KENALOG) 0.1 % Apply 1 application topically 2 (two) times a day. 03/23/19   [provider]  VENTOLIN HFA 108 (90 Base) MCG/ACT inhaler Inhale 2 puffs into the lungs every 4 (four) hours as needed for wheezing. 03/23/19   [provider]  potassium chloride SA (K-DUR) 20 MEQ tablet Take 1 tablet (20 mEq total) by mouth daily as needed (only on the days when taking lasix). 05/11/19 03/01/21  Enid Baas, MD    Allergies Patient has no known allergies.  No family history on file.  Social History Social History   Tobacco Use  . Smoking status: Never Smoker  . Smokeless tobacco: Never Used  Vaping Use  . Vaping Use: Never used  Substance Use Topics  . Alcohol use: Never  . Drug use: Never    Review of Systems Constitutional: No fever. Eyes: No visual changes. ENT: No sore throat. Cardiovascular: Denies chest pain. Respiratory: Denies shortness of breath. Gastrointestinal: No nausea, vomiting, diarrhea. Genitourinary: Negative for dysuria. Musculoskeletal: Negative for back pain. Skin: + Bilateral lower extremity redness, warmth Neurological: Negative for focal weakness or numbness.  ____________________________________________   PHYSICAL EXAM:  VITAL SIGNS: ED Triage Vitals  Enc Vitals Group     BP 03/01/21 1953 (!) 167/68     Pulse Rate 03/01/21 1953 67     Resp 03/01/21 1953 18  Temp 03/01/21 1953 98 F (36.7 C)     Temp Source 03/01/21 1953 Oral     SpO2 03/01/21 1953 99 %     Weight 03/01/21 1948 110 lb 0.2 oz (49.9 kg)     Height 03/01/21 1948 4\' 11"  (1.499 m)     Head Circumference --      Peak Flow --      Pain Score --      Pain Loc --      Pain Edu? --      Excl. in GC? --    CONSTITUTIONAL: Alert and oriented and responds appropriately to questions, elderly, in no distress HEAD: Normocephalic EYES:  Conjunctivae clear, pupils appear equal, EOM appear intact ENT: normal nose; moist mucous membranes NECK: Supple, normal ROM CARD: RRR; S1 and S2 appreciated; no murmurs, no clicks, no rubs, no gallops RESP: Normal chest excursion without splinting or tachypnea; breath sounds clear and equal bilaterally; no wheezes, no rhonchi, no rales, no hypoxia or respiratory distress, speaking full sentences ABD/GI: Normal bowel sounds; non-distended; soft, non-tender, no rebound, no guarding, no peritoneal signs, no hepatosplenomegaly BACK: The back appears normal EXT: Normal ROM in all joints; no deformity noted, patient is tender to palpation diffusely throughout her distal lower extremities with significant redness, warmth and pitting edema with open wounds that are weeping but no abscess or induration, compartments soft, 2+ DP pulses bilaterally SKIN: Normal color for age and race; warm; no rash on exposed skin NEURO: Moves all extremities equally PSYCH: The patient's mood and manner are appropriate.  ____________________________________________   LABS (all labs ordered are listed, but only abnormal results are displayed)  Labs Reviewed  BASIC METABOLIC PANEL - Abnormal; Notable for the following components:      Result Value   Glucose, Bld 100 (*)    Calcium 8.5 (*)    All other components within normal limits  CBC - Abnormal; Notable for the following components:   RBC 3.56 (*)    Hemoglobin 11.1 (*)    HCT 34.6 (*)    All other components within normal limits  BRAIN NATRIURETIC PEPTIDE - Abnormal; Notable for the following components:   B Natriuretic Peptide 138.4 (*)    All other components within normal limits  CULTURE, BLOOD (ROUTINE X 2)  CULTURE, BLOOD (ROUTINE X 2)  RESP PANEL BY RT-PCR (FLU A&B, COVID) [id number]   ____________________________________________  EKG   EKG Interpretation  Date/Time:  Sunday March 01 2021 20:03:56 EDT Ventricular Rate:  67 PR Interval:    QRS  Duration: 136 QT Interval:  406 QTC Calculation: 429 R Axis:   50 Text Interpretation: Normal sinus rhythm Right bundle branch block Abnormal ECG Confirmed by 10-01-2005 [phone]) on 03/02/2021 12:42:04 AM       ____________________________________________  RADIOLOGY 05/02/2021 Stanisha Lorenz, personally viewed and evaluated these images (plain radiographs) as part of my medical decision making, as well as reviewing the written report by the radiologist.  ED MD interpretation:  pending  Official radiology report(s): No results found.  ____________________________________________   PROCEDURES  Procedure(s) performed (including Critical Care):  Procedures    ____________________________________________   INITIAL IMPRESSION / ASSESSMENT AND PLAN / ED COURSE  As part of my medical decision making, I reviewed the following data within the electronic MEDICAL RECORD NUMBER History obtained from family, Nursing notes reviewed and incorporated, Interpreter needed, Labs reviewed , EKG interpreted , Discussed with admitting physician  and Notes from prior ED visits  Patient here with bilateral lower extremity cellulitis, edema with open wounds that are weeping.  Discussed treatment options with patient's daughter including IV antibiotics and admission versus home with oral antibiotics and close outpatient follow-up.  Daughter feels more comfortable with plan to admit which I feel is reasonable.  Her labs here are reassuring.  She is afebrile and does not appear toxic.  No chest pain or shortness of breath.  No signs of arterial obstruction, septic arthritis, compartment syndrome on exam.  Will obtain Dopplers to evaluate for DVT.  We will give vancomycin, ceftriaxone here.  Will discuss with medicine for admission.  ED PROGRESS  12:55 AM Discussed patient's case with hospitalist, Dr. Toniann Fail.  I have recommended admission and patient (and family if present) agree with this plan. Admitting  physician will place admission orders.   I reviewed all nursing notes, vitals, pertinent previous records and reviewed/interpreted all EKGs, lab and urine results, imaging (as available).   ____________________________________________   FINAL CLINICAL IMPRESSION(S) / ED DIAGNOSES  Final diagnoses:  Cellulitis of lower extremity, unspecified laterality     ED Discharge Orders    None      *Please note:  Marwah Danice Dippolito was evaluated in Emergency Department on 03/02/2021 for the symptoms described in the history of present illness. She was evaluated in the context of the global COVID-19 pandemic, which necessitated consideration that the patient might be at risk for infection with the SARS-CoV-2 virus that causes COVID-19. Institutional protocols and algorithms that pertain to the evaluation of patients at risk for COVID-19 are in a state of rapid change based on information released by regulatory bodies including the CDC and federal and state organizations. These policies and algorithms were followed during the patient's care in the ED.  Some ED evaluations and interventions may be delayed as a result of limited staffing during and the pandemic.*   Note:  This document was prepared using Dragon voice recognition software and may include unintentional dictation errors.   Jasyn Mey, Layla Maw, DO 03/02/21 [phone]

## 2021-03-02 NOTE — Evaluation (Signed)
Occupational Therapy Evaluation Patient Details Name: [name] MRN: [id number] DOB: 08-22-1918 Today's Date: 03/02/2021    History of Present Illness [name] is a 87 y.o. female with history of hypertension, chronic iron deficiency anemia on iron supplements, asthma and also takes Lasix as needed presents to the ER because of increasing swelling and weeping redness of both lower extremities ongoing for the last 1 week.  Denies any trauma or fall.  Denies any insect bites.  Had gone to the urgent care yesterday morning and was instructed to come to the ER to rule out DVT.   Clinical Impression   [name] engaged enthusiastically in therapy today. She had no difficulties getting out of bed, engaging in LB dressing, grooming in standing at sink, ambulating in room with SPC, transferring sit<>stand x 3, toileting, all with Mod I. She denies pain at present, although reports that b/l LE are itchy. Per son, who provided translation, pt dresses, baths, toilets, eats, w/o assistance. She does all her her own cleaning and cooking at home. Pt's son does grocery shopping and driving. Pt lives with her daughter, with sons nearby. Both [name] and her son state that she does not have any concerns re: functional mobility or ADL/IADL task performance at home. No further OT needed at this time.     Follow Up Recommendations  No OT follow up    Equipment Recommendations       Recommendations for Other Services       Precautions / Restrictions Precautions Precautions: Fall Restrictions Weight Bearing Restrictions: No      Mobility Bed Mobility Overal bed mobility: Modified Independent                  Transfers                      Balance Overall balance assessment: Needs assistance Sitting-balance support: No upper extremity supported Sitting balance-Leahy Scale: Good     Standing balance support: Single extremity supported Standing balance-Leahy Scale:  Good Standing balance comment: using SPC                           ADL either performed or assessed with clinical judgement   ADL Overall ADL's : Modified independent                                             Vision Patient Visual Report: No change from baseline       Perception     Praxis      Pertinent Vitals/Pain Pain Assessment: No/denies pain     Hand Dominance     Extremity/Trunk Assessment Upper Extremity Assessment Upper Extremity Assessment: Overall WFL for tasks assessed   Lower Extremity Assessment Lower Extremity Assessment: Overall WFL for tasks assessed       Communication Communication Communication: Interpreter utilized   Cognition Arousal/Alertness: Awake/alert Behavior During Therapy: WFL for tasks assessed/performed Overall Cognitive Status: Within Functional Limits for tasks assessed                                     General Comments       Exercises Other Exercises Other Exercises: Discussed DC plans with pt/son. Bed mobility, transfers,  ambulation, dressing, grooming, toileting, all with Mod I   Shoulder Instructions      Home Living Family/patient expects to be discharged to:: Private residence Living Arrangements: Children Available Help at Discharge: Family;Available 24 hours/day Type of Home: House Home Access: Level entry     Home Layout: Two level;Able to live on main level with bedroom/bathroom     Bathroom Shower/Tub: Producer, television/film/video: Standard     Home Equipment: Environmental consultant - 2 wheels;Cane - single point;Grab bars - tub/shower          Prior Functioning/Environment Level of Independence: Needs assistance  Gait / Transfers Assistance Needed: primarily uses SPC for ambulation ADL's / Homemaking Assistance Needed: performs all BADL and most IADL -- cooking, cleaning. Son does grocery shopping            OT Problem List: Decreased strength;Decreased  activity tolerance;Impaired balance (sitting and/or standing);Increased edema;Pain      OT Treatment/Interventions:      OT Goals(Current goals can be found in the care plan section) Acute Rehab OT Goals Patient Stated Goal: to go home ASAP OT Goal Formulation: With patient Time For Goal Achievement: 03/16/21 Potential to Achieve Goals: Good  OT Frequency:     Barriers to D/C:            Co-evaluation              AM-PAC OT "6 Clicks" Daily Activity     Outcome Measure Help from another person eating meals?: None Help from another person taking care of personal grooming?: None Help from another person toileting, which includes using toliet, bedpan, or urinal?: None Help from another person bathing (including washing, rinsing, drying)?: None Help from another person to put on and taking off regular upper body clothing?: None Help from another person to put on and taking off regular lower body clothing?: None 6 Click Score: 24   End of Session Equipment Utilized During Treatment: Other (comment) (SPC)  Activity Tolerance: Patient tolerated treatment well Patient left: in bed;with family/visitor present;with call bell/phone within reach;with bed alarm set  OT Visit Diagnosis: Other abnormalities of gait and mobility (R26.89);Pain;Muscle weakness (generalized) (M62.81) Pain - part of body: Leg                Time: 1856-3149 OT Time Calculation (min): 20 min Charges:  OT General Charges $OT Visit: 1 Visit OT Evaluation $OT Eval Moderate Complexity: 1 Mod OT Treatments $Self Care/Home Management : 8-22 mins  Latina Craver, PhD, MS, OTR/L 03/02/21, 3:46 PM

## 2021-03-02 NOTE — H&P (Signed)
History and Physical    [name] KNL:[id number] DOB: 1918-07-27 DOA: 03/02/2021  PCP: Rayetta Humphrey, MD  Patient coming from: Home.  Patient speaks [place].  History translated by patient's daughter.  Chief Complaint: Bilateral lower extremity redness and swelling.  HPI: [name] is a 87 y.o. female with history of hypertension, chronic iron deficiency anemia on iron supplements, asthma and also takes Lasix as needed presents to the ER because of increasing swelling and weeping redness of the both lower extremities ongoing for the last 1 week.  Denies any trauma or fall.  Denies any insect bites.  Had gone to the urgent care yesterday morning and was instructed to come to the ER to rule out DVT.  ED Course: On exam both lower extremities appears erythematous warm to touch sparing the foot.  It involves the whole circumference of both legs from knee below up to the ankle.  No signs of any compartment syndrome and pulses are felt.  COVID test is pending.  Labs mostly at baseline.  BNP is 138 but patient has no respiratory symptoms.  Is lying flat on the bed.  Patient was started on empiric antibiotics for cellulitis admitted for further observation.  Review of Systems: As per HPI, rest all negative.   Past Medical History:  Diagnosis Date  . Hypertension     History reviewed. No pertinent surgical history.   reports that she has never smoked. She has never used smokeless tobacco. She reports that she does not drink alcohol and does not use drugs.  No Known Allergies  Family History  Family history unknown: Yes    Prior to Admission medications   Medication Sig Start Date End Date Taking? Authorizing Provider  amLODipine (NORVASC) 10 MG tablet Take 10 mg by mouth daily. 03/24/19  Yes [provider]  azelastine (OPTIVAR) 0.05 % ophthalmic solution Place 1 drop into both eyes 2 (two) times a day. 03/26/19  Yes [provider]  Betamethasone Valerate  0.12 % foam Apply topically 2 (two) times daily. 10/15/20  Yes [provider]  clobetasol cream (TEMOVATE) 0.05 % Apply topically 2 (two) times daily. 10/28/20  Yes [provider]  diphenhydrAMINE (BENADRYL ALLERGY) 25 MG tablet Take 1 tablet (25 mg total) by mouth every 6 (six) hours as needed. 12/23/19  Yes Jene Every, MD  doxepin (SINEQUAN) 25 MG capsule Take 25 mg by mouth at bedtime. 02/24/21  Yes [provider]  FEROSUL 325 (65 Fe) MG tablet Take 325 mg by mouth 2 (two) times daily. 11/19/20  Yes [provider]  hydrOXYzine (ATARAX/VISTARIL) 10 MG tablet Take 10 mg by mouth 4 (four) times daily as needed for itching. 03/27/19  Yes [provider]  triamcinolone cream (KENALOG) 0.1 % Apply 1 application topically 2 (two) times a day. 03/23/19  Yes [provider]  VENTOLIN HFA 108 (90 Base) MCG/ACT inhaler Inhale 2 puffs into the lungs every 4 (four) hours as needed for wheezing. 03/23/19  Yes [provider]  potassium chloride SA (K-DUR) 20 MEQ tablet Take 1 tablet (20 mEq total) by mouth daily as needed (only on the days when taking lasix). 05/11/19 03/01/21  Enid Baas, MD    Physical Exam: Constitutional: Moderately built and nourished. Vitals:   03/01/21 1948 03/01/21 1953 03/02/21 0010 03/02/21 0030  BP:  (!) 167/68 (!) 152/73 (!) 146/71  Pulse:  67 68 65  Resp:  18 18 16   Temp:  98 F (36.7 C)  TempSrc:  Oral    SpO2:  99% 100% 100%  Weight: 49.9 kg     Height: 4\' 11"  (1.499 m)      Eyes: Anicteric no pallor. ENMT: No discharge from the ears eyes nose and mouth. Neck: No mass felt.  No JVD appreciated. Respiratory: No rhonchi or crepitations. Cardiovascular: S1-S2 heard. Abdomen: Soft nontender bowel sounds present. Musculoskeletal: Bilateral lower extremity edema particular in the legs sparing the foot. Skin: Bilateral lower extremity erythema and swelling involving the legs below the knee sparing the  foot with good pulses. Neurologic: Alert awake oriented to name and place moving all extremities. Psychiatric: Appears normal.   Labs on Admission: I have personally reviewed following labs and imaging studies  CBC: Recent Labs  Lab 03/01/21 2005  WBC 5.2  HGB 11.1*  HCT 34.6*  MCV 97.2  PLT 287   Basic Metabolic Panel: Recent Labs  Lab 03/01/21 2005  NA 140  K 4.4  CL 108  CO2 25  GLUCOSE 100*  BUN 18  CREATININE 0.85  CALCIUM 8.5*   GFR: Estimated Creatinine Clearance: 23.4 mL/min (by C-G formula based on SCr of 0.85 mg/dL). Liver Function Tests: No results for input(s): AST, ALT, ALKPHOS, BILITOT, PROT, ALBUMIN in the last 168 hours. No results for input(s): LIPASE, AMYLASE in the last 168 hours. No results for input(s): AMMONIA in the last 168 hours. Coagulation Profile: No results for input(s): INR, PROTIME in the last 168 hours. Cardiac Enzymes: No results for input(s): CKTOTAL, CKMB, CKMBINDEX, TROPONINI in the last 168 hours. BNP (last 3 results) No results for input(s): PROBNP in the last 8760 hours. HbA1C: No results for input(s): HGBA1C in the last 72 hours. CBG: No results for input(s): GLUCAP in the last 168 hours. Lipid Profile: No results for input(s): CHOL, HDL, LDLCALC, TRIG, CHOLHDL, LDLDIRECT in the last 72 hours. Thyroid Function Tests: No results for input(s): TSH, T4TOTAL, FREET4, T3FREE, THYROIDAB in the last 72 hours. Anemia Panel: No results for input(s): VITAMINB12, FOLATE, FERRITIN, TIBC, IRON, RETICCTPCT in the last 72 hours. Urine analysis:    Component Value Date/Time   COLORURINE STRAW (A) 03/15/2019 1058   APPEARANCEUR CLEAR (A) 03/15/2019 1058   APPEARANCEUR Clear 12/14/2011 1700   LABSPEC 1.008 03/15/2019 1058   LABSPEC 1.006 12/14/2011 1700   PHURINE 5.0 03/15/2019 1058   GLUCOSEU NEGATIVE 03/15/2019 1058   GLUCOSEU Negative 12/14/2011 1700   HGBUR SMALL (A) 03/15/2019 1058   BILIRUBINUR NEGATIVE 03/15/2019 1058    BILIRUBINUR Negative 12/14/2011 1700   KETONESUR 5 (A) 03/15/2019 1058   PROTEINUR NEGATIVE 03/15/2019 1058   NITRITE NEGATIVE 03/15/2019 1058   LEUKOCYTESUR NEGATIVE 03/15/2019 1058   LEUKOCYTESUR Negative 12/14/2011 1700   Sepsis Labs: @LABRCNTIP (procalcitonin:4,lacticidven:4) )No results found for this or any previous visit (from the past 240 hour(s)).   Radiological Exams on Admission: No results found.  EKG: Independently reviewed.  Normal sinus rhythm with RBBB.  Assessment/Plan Principal Problem:   Cellulitis of both lower extremities Active Problems:   Essential hypertension   Normocytic anemia   Cellulitis, leg    1. Possible bilateral lower extremity cellulitis -patient symptoms are more consistent with cellulitis at this time given the warmth and redness.  Patient has been placed on empiric antibiotics.  Dopplers have been ordered to rule out DVT.  No respiratory symptoms. 2. Hypertension uncontrolled on amlodipine.  Will add as needed IV hydralazine and follow blood pressure trends. 3. Chronic anemia on iron supplements.  Follow CBC. 4. History of  asthma on as needed albuterol. 5. Per report patient also takes as needed Lasix.  2D echo done in 2020 showed EF of 55 to 60%.  COVID test is pending.   DVT prophylaxis: Lovenox. Code Status: DNR confirmed with patient's daughter. Family Communication: Patient and daughter. Disposition Plan: Home. Consults called: None. Admission status: Observation.   Eduard Clos MD [facility] Pager [phone].  If 7PM-7AM, please contact night-coverage [email or web address] Password [email or web address]  03/02/2021, 1:19 AM

## 2021-03-02 NOTE — Progress Notes (Signed)
Pharmacy Antibiotic Note  [name] is a 87 y.o. female admitted on 03/02/2021 with cellulitis.  Pharmacy has been consulted for Cefepime and Vancomycin dosing.  Plan: Ordered Cefepime 2 gm q24h per indication and renal function.  Vancomycin Order initial dose of Vanc 1 gm per pt wt 49.9 kg Vancomycin 750 mg IV Q 48 hrs.  Goal AUC 400-550. Expected AUC: 438.1 SCr used: 0.85   Height: 4\' 11"  (149.9 cm) Weight: 49.9 kg (110 lb 0.2 oz) IBW/kg (Calculated) : 43.2  Temp (24hrs), Avg:97.8 F (36.6 C), Min:97.6 F (36.4 C), Max:98 F (36.7 C)  Recent Labs  Lab 03/01/21 2005  WBC 5.2  CREATININE 0.85    Estimated Creatinine Clearance: 23.4 mL/min (by C-G formula based on SCr of 0.85 mg/dL).    No Known Allergies  Antimicrobials this admission: 6/06 Cefepime >>  6/06 Vancomcyin >>   Microbiology results: 6/06 BCx: Pending  Thank you for allowing pharmacy to be a part of this patient's care.  8/06, PharmD, [facility] 03/02/2021 2:39 AM

## 2021-03-02 NOTE — Progress Notes (Signed)
PHARMACIST - PHYSICIAN COMMUNICATION  CONCERNING:  Enoxaparin (Lovenox) for DVT Prophylaxis    RECOMMENDATION: Patient was prescribed enoxaprin 40mg  q24 hours for VTE prophylaxis.   Filed Weights   03/01/21 1948  Weight: 49.9 kg (110 lb 0.2 oz)    Body mass index is 22.22 kg/m.  Estimated Creatinine Clearance: 23.4 mL/min (by C-G formula based on SCr of 0.85 mg/dL).   Patient is candidate for enoxaparin 30mg  every 24 hours based on CrCl <75ml/min or Weight <45kg  DESCRIPTION: Pharmacy has adjusted enoxaparin dose per [facility] policy.  Patient is now receiving enoxaparin 30 mg every 24 hours    31m, PharmD Clinical Pharmacist  03/02/2021 1:39 AM

## 2021-03-03 DIAGNOSIS — D649 Anemia, unspecified: Secondary | ICD-10-CM

## 2021-03-03 LAB — BASIC METABOLIC PANEL
Anion gap: 5 (ref 5–15)
BUN: 20 mg/dL (ref 8–23)
CO2: 24 mmol/L (ref 22–32)
Calcium: 7.9 mg/dL — ABNORMAL LOW (ref 8.9–10.3)
Chloride: 112 mmol/L — ABNORMAL HIGH (ref 98–111)
Creatinine, Ser: 0.97 mg/dL (ref 0.44–1.00)
GFR, Estimated: 52 mL/min — ABNORMAL LOW (ref >60)
Glucose, Bld: 92 mg/dL (ref 70–99)
Potassium: 3.8 mmol/L (ref 3.5–5.1)
Sodium: 141 mmol/L (ref 135–145)

## 2021-03-03 LAB — CBC
HCT: 28.3 % — ABNORMAL LOW (ref 36.0–46.0)
Hemoglobin: 9.5 g/dL — ABNORMAL LOW (ref 12.0–15.0)
MCH: 32.5 pg (ref 26.0–34.0)
MCHC: 33.6 g/dL (ref 30.0–36.0)
MCV: 96.9 fL (ref 80.0–100.0)
Platelets: 242 10*3/uL (ref 150–400)
RBC: 2.92 MIL/uL — ABNORMAL LOW (ref 3.87–5.11)
RDW: 13.1 % (ref 11.5–15.5)
WBC: 8.2 10*3/uL (ref 4.0–10.5)
nRBC: 0 % (ref 0.0–0.2)

## 2021-03-03 LAB — IRON AND TIBC
Iron: 37 ug/dL (ref 28–170)
Saturation Ratios: 16 % (ref 10.4–31.8)
TIBC: 234 ug/dL — ABNORMAL LOW (ref 250–450)
UIBC: 197 ug/dL

## 2021-03-03 LAB — FERRITIN: Ferritin: 303 ng/mL (ref 11–307)

## 2021-03-03 MED ORDER — DOXYCYCLINE MONOHYDRATE 100 MG PO TABS: 100.0000 mg | ORAL_TABLET | Freq: Two times a day (BID) | ORAL | 0 refills | Status: AC

## 2021-03-03 MED ORDER — SODIUM CHLORIDE 0.9 % IV SOLN
INTRAVENOUS | Status: DC | PRN
  Administered 2021-03-03: 250 mL via INTRAVENOUS

## 2021-03-03 NOTE — Discharge Summary (Signed)
Physician Discharge Summary  [name] OMV:[id number] DOB: [birth year] DOA: 03/02/2021  PCP: Rayetta Humphrey, MD  Admit date: 03/02/2021 Discharge date: 03/03/2021  Admitted From: home  Disposition:  Home   Recommendations for Outpatient Follow-up:  1. Follow up with PCP in 1 week   Home Health: no  Equipment/Devices:  Discharge Condition: stable  CODE STATUS: DNR  Diet recommendation: Heart Healthy  Brief/Interim Summary: HPI was taken from Dr. Toniann Fail: [name] is a 87 y.o. female with history of hypertension, chronic iron deficiency anemia on iron supplements, asthma and also takes Lasix as needed presents to the ER because of increasing swelling and weeping redness of the both lower extremities ongoing for the last 1 week.  Denies any trauma or fall.  Denies any insect bites.  Had gone to the urgent care yesterday morning and was instructed to come to the ER to rule out DVT.  ED Course: On exam both lower extremities appears erythematous warm to touch sparing the foot.  It involves the whole circumference of both legs from knee below up to the ankle.  No signs of any compartment syndrome and pulses are felt.  COVID test is pending.  Labs mostly at baseline.  BNP is 138 but patient has no respiratory symptoms.  Is lying flat on the bed.  Patient was started on empiric antibiotics for cellulitis admitted for further observation.  Hospital course from Dr. Mayford Knife 6/6-03/03/21: Pt presented w/ likely cellulitis of b/l LE. Pt was treated w/ IV cefepime, vanco while inpatient and switch to po doxycycline at d/c. [place] of b/l LE was done and it was neg for DVTs. PT/OT evaluated the pt and no further therapy was recommended. For more information, please see previous progress notes.   Discharge Diagnoses:  Principal Problem:   Cellulitis of both lower extremities Active Problems:   Essential hypertension   Normocytic anemia   Cellulitis, leg   Cellulitis  Possible b/l LE  cellulitis: improved erythema & warmth to palpation. Continue on IV cefepime, vanco while inpatient and switch to po doxy at d/c. [place] of b/l LE was neg for DVTs  HTN: continue on amlodipine. IV hydralazine prn  Normocytic anemia: iron is WNL. No need for a transfusion currently   Hx of asthma: unknown stage &/or severity. W/o exacerbation. Continue on bronchodilators prn   Discharge Instructions  Discharge Instructions    Diet - low sodium heart healthy   Complete by: As directed    Discharge instructions   Complete by: As directed    F/u w/ PCP in 1 week   Increase activity slowly   Complete by: As directed      Allergies as of 03/03/2021   No Known Allergies     Medication List    TAKE these medications   amLODipine 10 MG tablet Commonly known as: NORVASC Take 10 mg by mouth daily.   azelastine 0.05 % ophthalmic solution Commonly known as: OPTIVAR Place 1 drop into both eyes 2 (two) times a day. Notes to patient: Not given in hospital   Betamethasone Valerate 0.12 % foam Apply topically 2 (two) times daily. Notes to patient: Not applied in hospital   clobetasol cream 0.05 % Commonly known as: TEMOVATE Apply topically 2 (two) times daily. Notes to patient: Not applied in hospital   diphenhydrAMINE 25 MG tablet Commonly known as: Benadryl Allergy Take 1 tablet (25 mg total) by mouth every 6 (six) hours as needed. Notes to patient: Not given in hospital  doxepin 25 MG capsule Commonly known as: SINEQUAN Take 25 mg by mouth at bedtime.   doxycycline 100 MG tablet Commonly known as: ADOXA Take 1 tablet (100 mg total) by mouth 2 (two) times daily for 6 days.   FeroSul 325 (65 FE) MG tablet Generic drug: ferrous sulfate Take 325 mg by mouth 2 (two) times daily.   hydrOXYzine 10 MG tablet Commonly known as: ATARAX/VISTARIL Take 10 mg by mouth 4 (four) times daily as needed for itching. Notes to patient: Not given in hospital   triamcinolone cream 0.1  % Commonly known as: KENALOG Apply 1 application topically 2 (two) times a day.   Ventolin HFA 108 (90 Base) MCG/ACT inhaler Generic drug: albuterol Inhale 2 puffs into the lungs every 4 (four) hours as needed for wheezing. Notes to patient: Not given in hospital       No Known Allergies  Consultations:     Procedures/Studies: US Venous Img Lower Bilateral  Result Date: 03/02/2021 CLINICAL DATA:  Leg swelling and erythema EXAM: BILATERAL LOWER EXTREMITY VENOUS DOPPLER ULTRASOUND TECHNIQUE: Gray-scale sonography with compression, as well as color and duplex ultrasound, were performed to evaluate the deep venous system(s) from the level of the common femoral vein through the popliteal and proximal calf veins. COMPARISON:  None. FINDINGS: VENOUS Normal compressibility of the common femoral, superficial femoral, and popliteal veins, as well as the visualized calf veins. Visualized portions of profunda femoral vein and great saphenous vein unremarkable. No filling defects to suggest DVT on grayscale or color Doppler imaging. Doppler waveforms show normal direction of venous flow, normal respiratory plasticity and response to augmentation. OTHER None. Limitations: none IMPRESSION: Negative. Electronically Signed   By: Helyn Numbers MD   On: 03/02/2021 02:46      Subjective: Pt denies any complaints   Discharge Exam: Vitals:   03/03/21 0432 03/03/21 0757  BP: (!) 128/51 (!) 127/56  Pulse: 72 70  Resp: 17 16  Temp: 98.4 F (36.9 C) 98.5 F (36.9 C)  SpO2: 94% 100%   Vitals:   03/02/21 1955 03/03/21 0018 03/03/21 0432 03/03/21 0757  BP: (!) 120/55 (!) 128/56 (!) 128/51 (!) 127/56  Pulse: 77 66 72 70  Resp: 17 17 17 16   Temp: 98.2 F (36.8 C) 98 F (36.7 C) 98.4 F (36.9 C) 98.5 F (36.9 C)  TempSrc: Oral  Oral Oral  SpO2: 97% 100% 94% 100%  Weight:      Height:        General: Pt is alert, awake, not in acute distress Cardiovascular: S1/S2 +, no rubs, no  gallops Respiratory: CTA bilaterally, no wheezing, no rhonchi Abdominal: Soft, NT, ND, bowel sounds + Extremities: no edema, no cyanosis    The results of significant diagnostics from this hospitalization (including imaging, microbiology, ancillary and laboratory) are listed below for reference.     Microbiology: Recent Results (from the past 240 hour(s))  Culture, blood (Routine X 2) w Reflex to ID Panel     Status: None (Preliminary result)   Collection Time: 03/02/21  1:27 AM   Specimen: BLOOD  Result Value Ref Range Status   Specimen Description BLOOD RIGHT HAND  Final   Special Requests   Final    BOTTLES DRAWN AEROBIC AND ANAEROBIC Blood Culture results may not be optimal due to an inadequate volume of blood received in culture bottles   Culture   Final    NO GROWTH 1 DAY Performed at [facility], [address] [place],  [place] [address]    Report Status PENDING  Incomplete  Resp Panel by RT-PCR (Flu A&B, Covid) Nasopharyngeal Swab     Status: None   Collection Time: 03/02/21  1:27 AM   Specimen: Nasopharyngeal Swab; Nasopharyngeal(NP) swabs in vial transport medium  Result Value Ref Range Status   SARS Coronavirus 2 by RT PCR NEGATIVE NEGATIVE Final    Comment: (NOTE) SARS-CoV-2 target nucleic acids are NOT DETECTED.  The SARS-CoV-2 RNA is generally detectable in upper respiratory specimens during the acute phase of infection. The lowest concentration of SARS-CoV-2 viral copies this assay can detect is 138 copies/mL. A negative result does not preclude SARS-Cov-2 infection and should not be used as the sole basis for treatment or other patient management decisions. A negative result may occur with  improper specimen collection/handling, submission of specimen other than nasopharyngeal swab, presence of viral mutation(s) within the areas targeted by this assay, and inadequate number of viral copies(<138 copies/mL). A negative result must be combined  with clinical observations, patient history, and epidemiological information. The expected result is Negative.  Fact Sheet for Patients:  BloggerCourse.comhttps://www.fda.gov/media/152166/download  Fact Sheet for Healthcare Providers:  SeriousBroker.ithttps://www.fda.gov/media/152162/download  This test is no t yet approved or cleared by the [place] FDA and  has been authorized for detection and/or diagnosis of SARS-CoV-2 by FDA under an Emergency Use Authorization (EUA). This EUA will remain  in effect (meaning this test can be used) for the duration of the COVID-19 declaration under Section 564(b)(1) of the Act, 21 U.S.C.section 360bbb-3(b)(1), unless the authorization is terminated  or revoked sooner.       Influenza A by PCR NEGATIVE NEGATIVE Final   Influenza B by PCR NEGATIVE NEGATIVE Final    Comment: (NOTE) The Xpert Xpress SARS-CoV-2/FLU/RSV plus assay is intended as an aid in the diagnosis of influenza from Nasopharyngeal swab specimens and should not be used as a sole basis for treatment. Nasal washings and aspirates are unacceptable for Xpert Xpress SARS-CoV-2/FLU/RSV testing.  Fact Sheet for Patients: BloggerCourse.comhttps://www.fda.gov/media/152166/download  Fact Sheet for Healthcare Providers: SeriousBroker.ithttps://www.fda.gov/media/152162/download  This test is not yet approved or cleared by the [place] FDA and has been authorized for detection and/or diagnosis of SARS-CoV-2 by FDA under an Emergency Use Authorization (EUA). This EUA will remain in effect (meaning this test can be used) for the duration of the COVID-19 declaration under Section 564(b)(1) of the Act, 21 U.S.C. section 360bbb-3(b)(1), unless the authorization is terminated or revoked.  Performed at [facility], [address] [place], [place] [address]   Culture, blood (Routine X 2) w Reflex to ID Panel     Status: None (Preliminary result)   Collection Time: 03/02/21  1:28 AM   Specimen: BLOOD  Result Value Ref Range Status    Specimen Description BLOOD LEFT HAND  Final   Special Requests   Final    BOTTLES DRAWN AEROBIC AND ANAEROBIC Blood Culture results may not be optimal due to an inadequate volume of blood received in culture bottles   Culture   Final    NO GROWTH 1 DAY Performed at [facility], [address] [place], [place] [address]    Report Status PENDING  Incomplete     Labs: BNP (last 3 results) Recent Labs    03/01/21 2006  BNP 138.4*   Basic Metabolic Panel: Recent Labs  Lab 03/01/21 2005 03/02/21 0534 03/03/21 0534  NA 140 139 141  K 4.4 3.9 3.8  CL 108 111 112*  CO2 25  20* 24  GLUCOSE 100* 84 92  BUN CREATININE 0.85 0.68 0.97  CALCIUM 8.5* 7.9* 7.9*   Liver Function Tests: No results for input(s): AST, ALT, ALKPHOS, BILITOT, PROT, ALBUMIN in the last 168 hours. No results for input(s): LIPASE, AMYLASE in the last 168 hours. No results for input(s): AMMONIA in the last 168 hours. CBC: Recent Labs  Lab 03/01/21 2005 03/02/21 0534 03/03/21 0534  WBC 5.2 9.1 8.2  HGB 11.1* 11.3* 9.5*  HCT 34.6* 33.8* 28.3*  MCV 97.2 96.0 96.9  PLT 287 276 242   Cardiac Enzymes: No results for input(s): CKTOTAL, CKMB, CKMBINDEX, TROPONINI in the last 168 hours. BNP: Invalid input(s): POCBNP CBG: No results for input(s): GLUCAP in the last 168 hours. D-Dimer No results for input(s): DDIMER in the last 72 hours. Hgb A1c No results for input(s): HGBA1C in the last 72 hours. Lipid Profile No results for input(s): CHOL, HDL, LDLCALC, TRIG, CHOLHDL, LDLDIRECT in the last 72 hours. Thyroid function studies No results for input(s): TSH, T4TOTAL, T3FREE, THYROIDAB in the last 72 hours.  Invalid input(s): FREET3 Anemia work up Recent Labs    03/03/21 0900  FERRITIN 303  TIBC 234*  IRON 37   Urinalysis    Component Value Date/Time   COLORURINE STRAW (A) 03/15/2019 1058   APPEARANCEUR CLEAR (A) 03/15/2019 1058   APPEARANCEUR Clear 12/14/2011 1700    LABSPEC 1.008 03/15/2019 1058   LABSPEC 1.006 12/14/2011 1700   PHURINE 5.0 03/15/2019 1058   GLUCOSEU NEGATIVE 03/15/2019 1058   GLUCOSEU Negative 12/14/2011 1700   HGBUR SMALL (A) 03/15/2019 1058   BILIRUBINUR NEGATIVE 03/15/2019 1058   BILIRUBINUR Negative 12/14/2011 1700   KETONESUR 5 (A) 03/15/2019 1058   PROTEINUR NEGATIVE 03/15/2019 1058   NITRITE NEGATIVE 03/15/2019 1058   LEUKOCYTESUR NEGATIVE 03/15/2019 1058   LEUKOCYTESUR Negative 12/14/2011 1700   Sepsis Labs Invalid input(s): PROCALCITONIN,  WBC,  LACTICIDVEN Microbiology Recent Results (from the past 240 hour(s))  Culture, blood (Routine X 2) w Reflex to ID Panel     Status: None (Preliminary result)   Collection Time: 03/02/21  1:27 AM   Specimen: BLOOD  Result Value Ref Range Status   Specimen Description BLOOD RIGHT HAND  Final   Special Requests   Final    BOTTLES DRAWN AEROBIC AND ANAEROBIC Blood Culture results may not be optimal due to an inadequate volume of blood received in culture bottles   Culture   Final    NO GROWTH 1 DAY Performed at [facility], [address] [place], [place] [address]    Report Status PENDING  Incomplete  Resp Panel by RT-PCR (Flu A&B, Covid) Nasopharyngeal Swab     Status: None   Collection Time: 03/02/21  1:27 AM   Specimen: Nasopharyngeal Swab; Nasopharyngeal(NP) swabs in vial transport medium  Result Value Ref Range Status   SARS Coronavirus 2 by RT PCR NEGATIVE NEGATIVE Final    Comment: (NOTE) SARS-CoV-2 target nucleic acids are NOT DETECTED.  The SARS-CoV-2 RNA is generally detectable in upper respiratory specimens during the acute phase of infection. The lowest concentration of SARS-CoV-2 viral copies this assay can detect is 138 copies/mL. A negative result does not preclude SARS-Cov-2 infection and should not be used as the sole basis for treatment or other patient management decisions. A negative result may occur with  improper specimen  collection/handling, submission of specimen other than nasopharyngeal swab, presence of viral mutation(s) within the areas targeted by this assay, and  inadequate number of viral copies(<138 copies/mL). A negative result must be combined with clinical observations, patient history, and epidemiological information. The expected result is Negative.  Fact Sheet for Patients:  BloggerCourse.com  Fact Sheet for Healthcare Providers:  SeriousBroker.it  This test is no t yet approved or cleared by the [place] FDA and  has been authorized for detection and/or diagnosis of SARS-CoV-2 by FDA under an Emergency Use Authorization (EUA). This EUA will remain  in effect (meaning this test can be used) for the duration of the COVID-19 declaration under Section 564(b)(1) of the Act, 21 U.S.C.section 360bbb-3(b)(1), unless the authorization is terminated  or revoked sooner.       Influenza A by PCR NEGATIVE NEGATIVE Final   Influenza B by PCR NEGATIVE NEGATIVE Final    Comment: (NOTE) The Xpert Xpress SARS-CoV-2/FLU/RSV plus assay is intended as an aid in the diagnosis of influenza from Nasopharyngeal swab specimens and should not be used as a sole basis for treatment. Nasal washings and aspirates are unacceptable for Xpert Xpress SARS-CoV-2/FLU/RSV testing.  Fact Sheet for Patients: BloggerCourse.com  Fact Sheet for Healthcare Providers: SeriousBroker.it  This test is not yet approved or cleared by the [place] FDA and has been authorized for detection and/or diagnosis of SARS-CoV-2 by FDA under an Emergency Use Authorization (EUA). This EUA will remain in effect (meaning this test can be used) for the duration of the COVID-19 declaration under Section 564(b)(1) of the Act, 21 U.S.C. section 360bbb-3(b)(1), unless the authorization is terminated or revoked.  Performed at [facility], [address] [place], [place] [address]   Culture, blood (Routine X 2) w Reflex to ID Panel     Status: None (Preliminary result)   Collection Time: 03/02/21  1:28 AM   Specimen: BLOOD  Result Value Ref Range Status   Specimen Description BLOOD LEFT HAND  Final   Special Requests   Final    BOTTLES DRAWN AEROBIC AND ANAEROBIC Blood Culture results may not be optimal due to an inadequate volume of blood received in culture bottles   Culture   Final    NO GROWTH 1 DAY Performed at [facility], [address] [place], [place] [address]    Report Status PENDING  Incomplete     Time coordinating discharge: Over 30 minutes  SIGNED:   Charise Killian, MD  [facility] 03/03/2021, 11:08 AM Pager   If 7PM-7AM, please contact night-coverage

## 2021-03-03 NOTE — Evaluation (Signed)
Physical Therapy Evaluation Patient Details Name: [name] MRN: [id number] DOB: 1918/05/25 Today's Date: 03/03/2021   History of Present Illness  [name] is a 87yo [place] speaking F who comes to [facility] on 6/5 from urgent care after concerns for DVT. Pt has had bilat leg swelling adn redness x 1 week. PMH: iron deficiency anemia on iron supplements, asthma, HTN. Pt started on ABX for Bilat cellulitis. LE US DVT study negative.  Clinical Impression  Pt admitted with above diagnosis. Pt currently with functional limitations due to the deficits listed below (see "PT Problem List"). Patient agreeable to PT evaluation. DTR prefers to act as interpreter for patient. DTR provides detailed description of PLOF and home environment. Supervision level bed mobility and transfers, minGuardA AMB in room with [facility] with 1 LOB that requires physical assist. Patient's assessment this date reveals the patient requires an additional person present for safety and/or physical assistance to complete their typical ADL. Pt is reportedly at or very near her baseline, the patient is able to perform ADL with modified independence. Patient will benefit from skilled PT intervention to maximize independence and safety in mobility required for basic ADL performance at discharge.        Follow Up Recommendations No PT follow up    Equipment Recommendations  None recommended by PT    Recommendations for Other Services       Precautions / Restrictions Precautions Precautions: Fall Restrictions Weight Bearing Restrictions: No      Mobility  Bed Mobility Overal bed mobility: Modified Independent                  Transfers Overall transfer level: Needs assistance   Transfers: Sit to/from Stand Sit to Stand: Supervision            Ambulation/Gait   Gait Distance (Feet): 80 Feet Assistive device: Straight cane Gait Pattern/deviations: Decreased weight shift to left;Decreased step length -  right        Stairs            Wheelchair Mobility    Modified Rankin (Stroke Patients Only)       Balance                                             Pertinent Vitals/Pain Pain Assessment: No/denies pain    Home Living Family/patient expects to be discharged to:: Private residence Living Arrangements: Children (DTR Maby Lam) Available Help at Discharge: Family;Available 24 hours/day Type of Home: House Home Access: Level entry     Home Layout: Two level;Able to live on main level with bedroom/bathroom Home Equipment: Dan Humphreys - 2 wheels;Cane - single point;Grab bars - tub/shower      Prior Function Level of Independence: Needs assistance   Gait / Transfers Assistance Needed: primarily uses SPC for household ambulation  ADL's / Homemaking Assistance Needed: performs all BADL and most IADL -- cooking, cleaning. Son does grocery shopping        Hand Dominance   Dominant Hand: Right    Extremity/Trunk Assessment   Upper Extremity Assessment Upper Extremity Assessment: Generalized weakness;Overall [facility] for tasks assessed    Lower Extremity Assessment Lower Extremity Assessment: Generalized weakness;Overall [facility] for tasks assessed    Cervical / Trunk Assessment Cervical / Trunk Assessment: Normal  Communication      Cognition Arousal/Alertness: Awake/alert Behavior During Therapy: WFL for tasks  assessed/performed Overall Cognitive Status: Difficult to assess                                        General Comments      Exercises     Assessment/Plan    PT Assessment Patient needs continued PT services  PT Problem List Decreased activity tolerance;Decreased balance;Decreased mobility       PT Treatment Interventions DME instruction;Gait training;Functional mobility training;Therapeutic activities;Therapeutic exercise;Patient/family education    PT Goals (Current goals can be found in the Care Plan section)   Acute Rehab PT Goals Patient Stated Goal: to go home ASAP PT Goal Formulation: With patient Time For Goal Achievement: 03/17/21 Potential to Achieve Goals: Good    Frequency Min 2X/week   Barriers to discharge        Co-evaluation               AM-PAC PT "6 Clicks" Mobility  Outcome Measure Help needed turning from your back to your side while in a flat bed without using bedrails?: None Help needed moving from lying on your back to sitting on the side of a flat bed without using bedrails?: None Help needed moving to and from a bed to a chair (including a wheelchair)?: A Little Help needed standing up from a chair using your arms (e.g., wheelchair or bedside chair)?: A Little Help needed to walk in hospital room?: A Little Help needed climbing 3-5 steps with a railing? : A Little 6 Click Score: 20    End of Session   Activity Tolerance: Patient tolerated treatment well;No increased pain Patient left: in bed;with family/visitor present;with call bell/phone within reach Nurse Communication: Mobility status PT Visit Diagnosis: Difficulty in walking, not elsewhere classified (R26.2);Unsteadiness on feet (R26.81);Other abnormalities of gait and mobility (R26.89)    Time: 9735-3299 PT Time Calculation (min) (ACUTE ONLY): 10 min   Charges:   PT Evaluation $PT Eval Low Complexity: 1 Low          12:04 PM, 03/03/21 Rosamaria Lints, PT, DPT Physical Therapist - [facility]  [phone] (ASCOM)   Jenilyn Magana C 03/03/2021, 12:02 PM

## 2021-03-03 NOTE — Plan of Care (Signed)

## 2021-03-07 LAB — CULTURE, BLOOD (ROUTINE X 2)
Culture: NO GROWTH
Culture: NO GROWTH

## 2021-08-04 IMAGING — US US EXTREM LOW VENOUS
1 series · 14 of 24 positions shown · non-contrast
Comparison: None.

CLINICAL DATA: Leg swelling and erythema

EXAM:
BILATERAL LOWER EXTREMITY VENOUS DOPPLER ULTRASOUND
TECHNIQUE: Gray-scale sonography with compression, as well as color and duplex
ultrasound, were performed to evaluate the deep venous system(s)
from the level of the common femoral vein through the popliteal and
proximal calf veins.

[Series 1: us venous img lower bilat (dvt) · portal-venous · 14 of 48 slices shown]
[im 1/48]
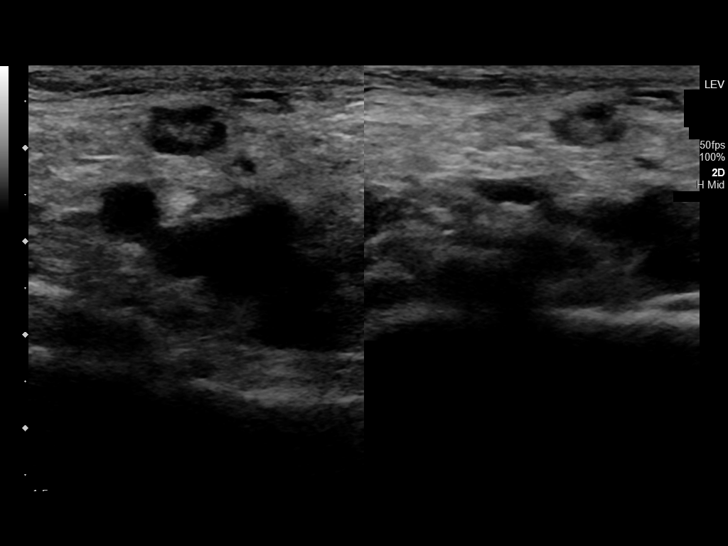
[im 5/48]
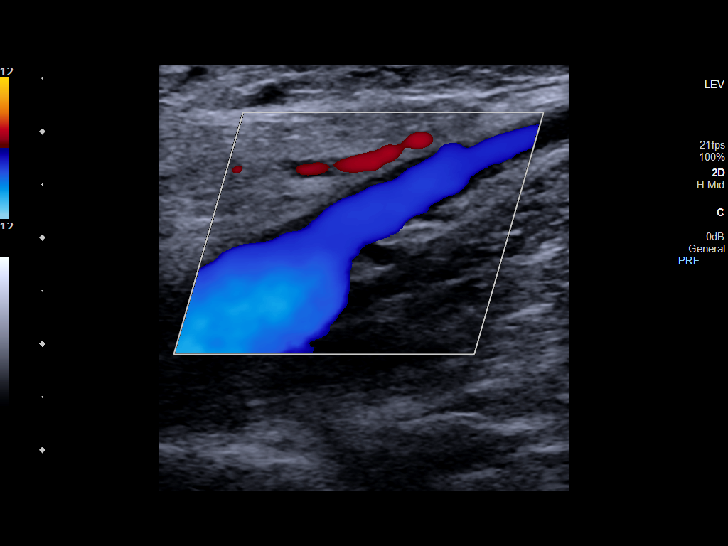
[im 9/48]
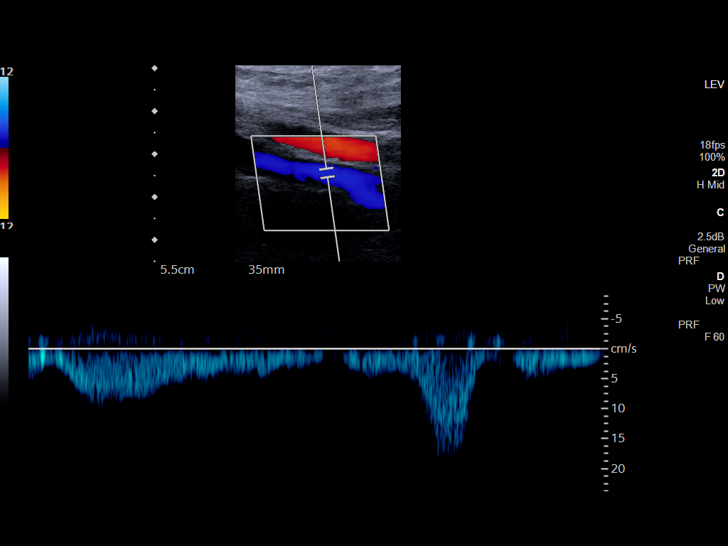
[im 13/48]
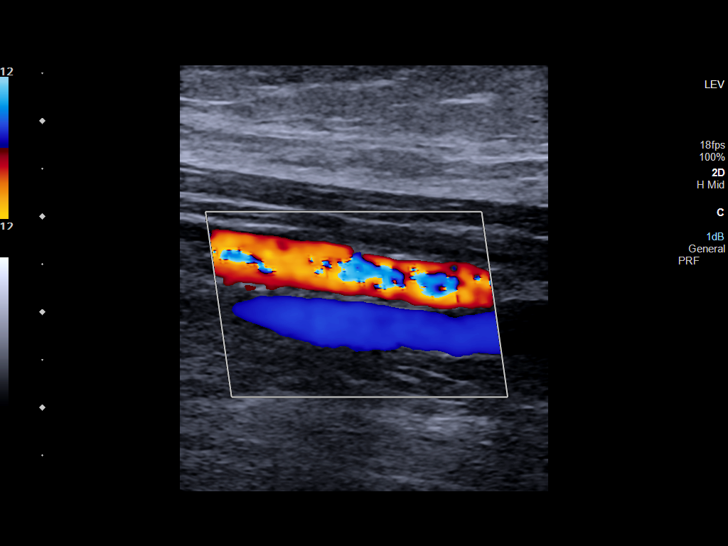
[im 15/48]
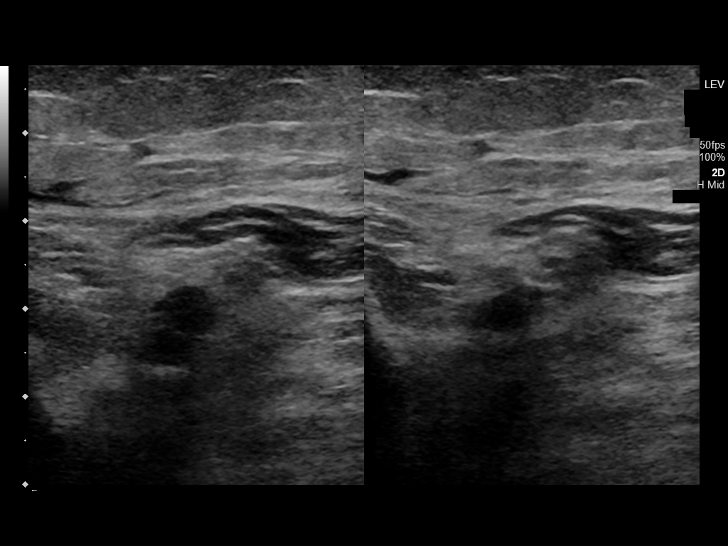
[im 19/48]
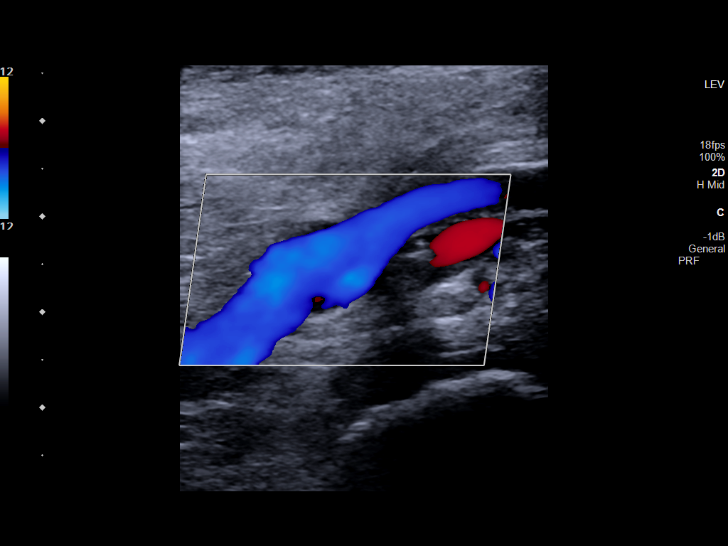
[im 23/48]
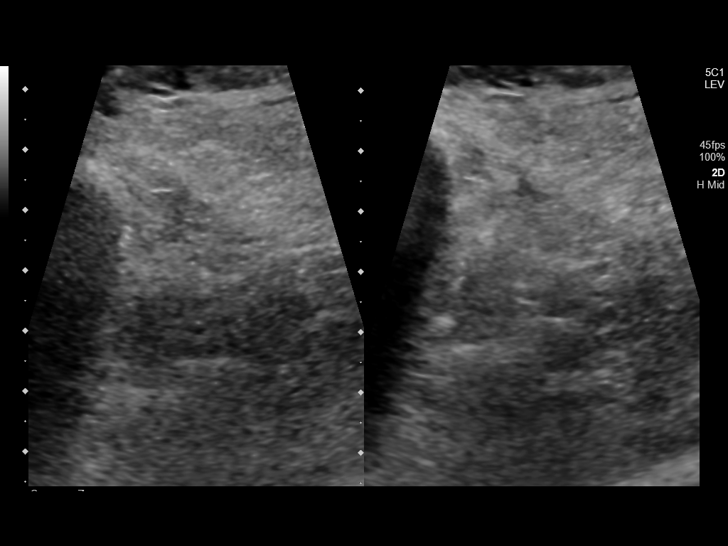
[im 25/48]
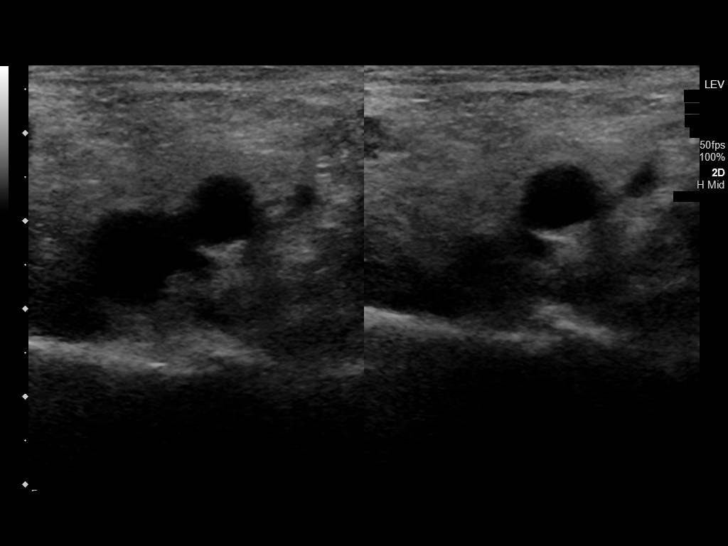
[im 29/48]
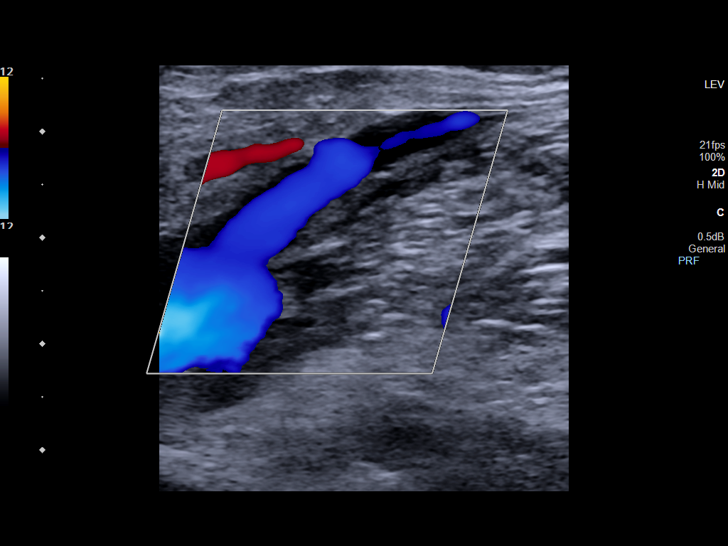
[im 33/48]
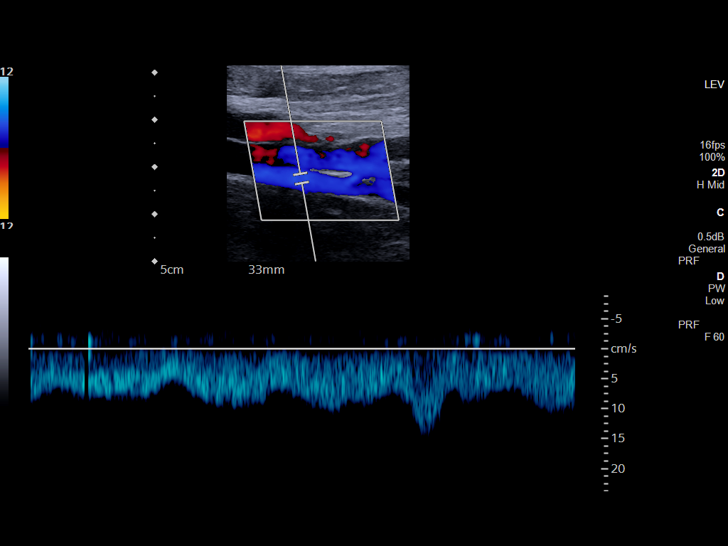
[im 37/48]
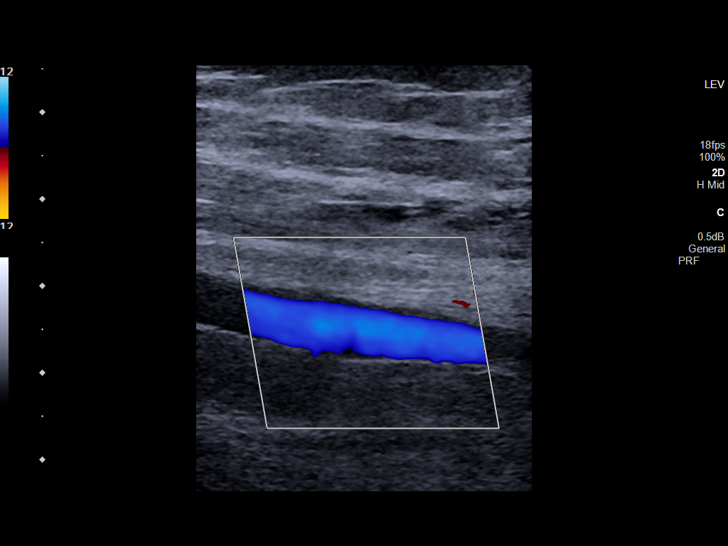
[im 39/48]
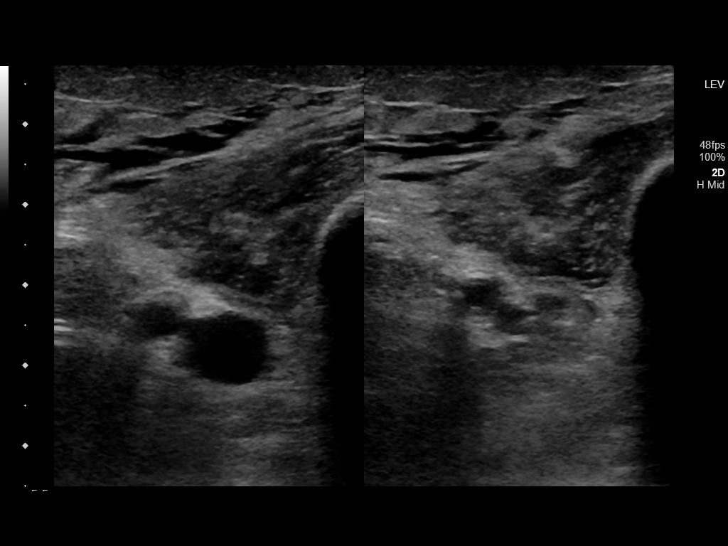
[im 43/48]
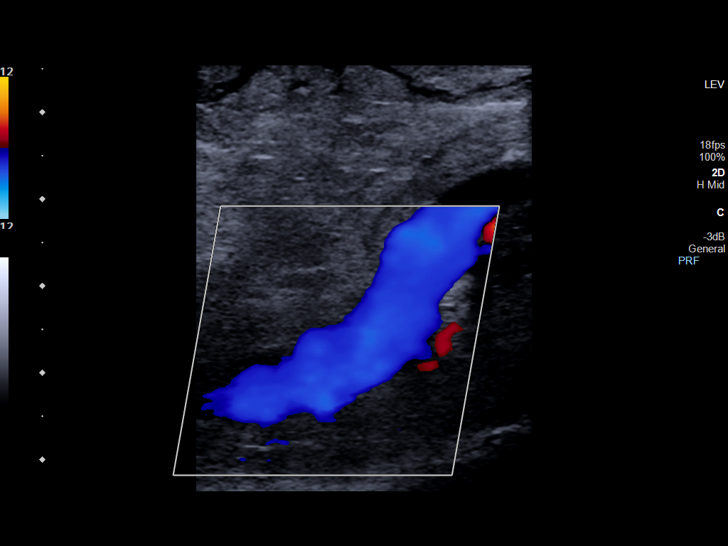
[im 48/48]
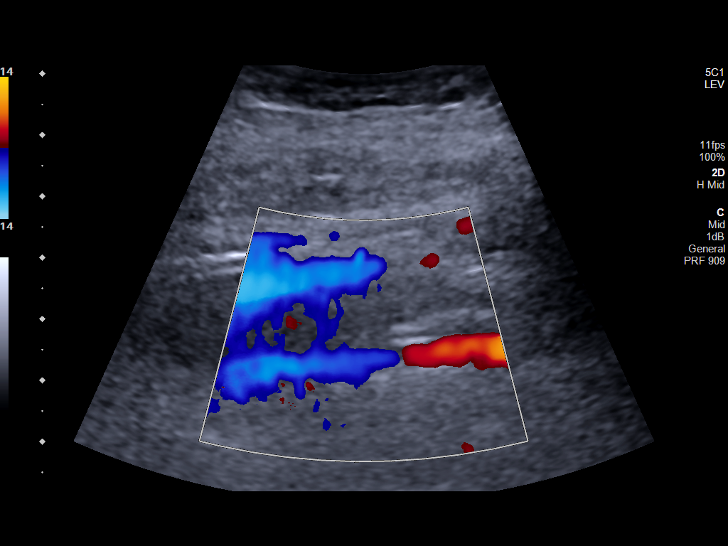

[14 of 24 positions shown; findings below may reference images not displayed]

FINDINGS: VENOUS

Normal compressibility of the common femoral, superficial femoral,
and popliteal veins, as well as the visualized calf veins.
Visualized portions of profunda femoral vein and great saphenous
vein unremarkable. No filling defects to suggest DVT on grayscale or
color Doppler imaging. Doppler waveforms show normal direction of
venous flow, normal respiratory plasticity and response to
augmentation.

OTHER

None.

Limitations: none
IMPRESSION: Negative.
